# Patient Record
Sex: Female | Born: 1950 | Race: White | Hispanic: No | Smoking: Never smoker
Health system: Southern US, Community
[De-identification: ages and names within clinical notes are randomized; demographics above are authoritative.]

## PROBLEM LIST (undated history)

## (undated) DIAGNOSIS — F419 Anxiety disorder, unspecified: Secondary | ICD-10-CM

## (undated) DIAGNOSIS — G4730 Sleep apnea, unspecified: Secondary | ICD-10-CM

## (undated) DIAGNOSIS — J40 Bronchitis, not specified as acute or chronic: Secondary | ICD-10-CM

## (undated) DIAGNOSIS — K219 Gastro-esophageal reflux disease without esophagitis: Secondary | ICD-10-CM

## (undated) DIAGNOSIS — J189 Pneumonia, unspecified organism: Secondary | ICD-10-CM

## (undated) DIAGNOSIS — I10 Essential (primary) hypertension: Secondary | ICD-10-CM

## (undated) HISTORY — PX: OTHER SURGICAL HISTORY: SHX169

## (undated) HISTORY — PX: IRIDOTOMY / IRIDECTOMY: SHX165

## (undated) HISTORY — PX: BREAST SURGERY: SHX581

## (undated) HISTORY — PX: OVARY SURGERY: SHX727

---

## 2014-10-25 ENCOUNTER — Encounter: Payer: Self-pay | Admitting: Internal Medicine

## 2015-06-07 DIAGNOSIS — R053 Chronic cough: Secondary | ICD-10-CM | POA: Insufficient documentation

## 2015-06-07 DIAGNOSIS — R0982 Postnasal drip: Secondary | ICD-10-CM | POA: Insufficient documentation

## 2015-06-07 DIAGNOSIS — F411 Generalized anxiety disorder: Secondary | ICD-10-CM | POA: Insufficient documentation

## 2015-06-07 DIAGNOSIS — R05 Cough: Secondary | ICD-10-CM | POA: Insufficient documentation

## 2015-06-07 DIAGNOSIS — R5383 Other fatigue: Secondary | ICD-10-CM | POA: Insufficient documentation

## 2015-08-01 ENCOUNTER — Encounter: Payer: Self-pay | Admitting: Internal Medicine

## 2016-09-26 DIAGNOSIS — E042 Nontoxic multinodular goiter: Secondary | ICD-10-CM | POA: Insufficient documentation

## 2017-01-24 DIAGNOSIS — E7800 Pure hypercholesterolemia, unspecified: Secondary | ICD-10-CM | POA: Insufficient documentation

## 2018-01-27 ENCOUNTER — Encounter: Payer: Self-pay | Admitting: Internal Medicine

## 2018-01-28 ENCOUNTER — Ambulatory Visit (INDEPENDENT_AMBULATORY_CARE_PROVIDER_SITE_OTHER): Payer: 59 | Admitting: Internal Medicine

## 2018-01-28 ENCOUNTER — Encounter: Payer: Self-pay | Admitting: Internal Medicine

## 2018-01-28 ENCOUNTER — Institutional Professional Consult (permissible substitution): Payer: Self-pay | Admitting: Internal Medicine

## 2018-01-28 VITALS — BP 130/80 | HR 80 | Ht 61.5 in | Wt 165.0 lb

## 2018-01-28 DIAGNOSIS — G4733 Obstructive sleep apnea (adult) (pediatric): Secondary | ICD-10-CM | POA: Diagnosis not present

## 2018-01-28 DIAGNOSIS — K219 Gastro-esophageal reflux disease without esophagitis: Secondary | ICD-10-CM

## 2018-01-28 DIAGNOSIS — G4739 Other sleep apnea: Secondary | ICD-10-CM | POA: Diagnosis not present

## 2018-01-28 DIAGNOSIS — I10 Essential (primary) hypertension: Secondary | ICD-10-CM

## 2018-01-28 DIAGNOSIS — F5101 Primary insomnia: Secondary | ICD-10-CM | POA: Diagnosis not present

## 2018-01-28 DIAGNOSIS — H409 Unspecified glaucoma: Secondary | ICD-10-CM | POA: Insufficient documentation

## 2018-01-28 DIAGNOSIS — G4700 Insomnia, unspecified: Secondary | ICD-10-CM | POA: Insufficient documentation

## 2018-01-28 NOTE — Assessment & Plan Note (Signed)
She clearly benefits from CPAP, noting improved sleep, prevention of gasping and morning headache.  She is comfortable with her pressures and nasal pillows mask.  Needs help as she has moved into this area, establishing with local DME.  Was working with [facility] in [place] so we will contact them. Plan-local DME to continue Auto Servo Ventilation EPAP 6, Min PS 5, Max PS 12

## 2018-01-28 NOTE — Patient Instructions (Signed)
Order- new DME ([facility] was her DME in [place]) to continue ASV EPAP 6, Min PS 5, Max PS 12, mask of choice, humidifier, supplies, AirView , chin strap   Dx OSA  Please call as needed

## 2018-01-28 NOTE — Assessment & Plan Note (Signed)
Chronic insomnia issue which she manages.  Uses Ativan only about once a week by her report.  Recognizes a component of anxiety.  Emphasis on good sleep hygiene, minimize dependence on medication where possible.

## 2018-01-28 NOTE — Progress Notes (Signed)
01/28/2018-67 year old female never smoker for sleep evaluation. Sleep Consult: Self referral. Pt currently on BiPAP through [facility]/ [place] with hx Mixed Sleep Apnea Medical problem list includes HBP, thyroid nodules, anxiety, GERD, Glaucoma  NPSG 10/25/14 [facility] AHI 40.6/ hr (Central 102, Obst 40, hypopneas 171), Desaturation to 83%, body weight  CPAP titration 08/01/15- [facility]V for Central predominant Mixed OSA  Download 98% compliance AHI 0.4/hour on Auto Servo Ventilation EPAP6, Min PS 5, Max PS 12 She started CPAP in [place] and was changed quickly to BiPAP for mixed OSA.  She feels much better off with her Pap device which prevents gasping and prevents morning headaches. Still wakes frequently at night and gives history of nonspecific insomnia.  Bedtime between 10 and 11 PM estimating 30 to 60-minute sleep latency, up at 8 AM.  Tried trazodone years ago.  Has settled on Ativan 1 mg used about 1 time a week.  Little caffeine.  Sleeping alone with son at the other end of the house. ENT surgery for tonsils as a child.  Occasional bronchitis but no active lung disease and never smoked.  Denies heart disease or neurologic issues. Epworth score 2  Prior to Admission medications   Medication Sig Start Date End Date Taking? Authorizing Provider  Azilsartan Medoxomil (EDARBI) 40 MG TABS Take 40 mg by mouth daily.  11/24/17  Yes [provider]  Cholecalciferol (VITAMIN D3) 400 units CAPS Take by mouth.   Yes [provider]  Coenzyme Q10 (COQ10) 200 MG CAPS Take by mouth.   Yes [provider]  Docosahexaenoic Acid (OMEGA-3 DHA) POWD Take by mouth.   Yes [provider]  esomeprazole (NEXIUM) 20 MG capsule Take 20 mg by mouth daily.    Yes [provider]  hydrochlorothiazide (MICROZIDE) 12.5 MG capsule Take 12.5 mg by mouth daily as needed. When BP is elevated 11/25/17  Yes [provider]  LORazepam  (ATIVAN) 1 MG tablet Take 1 mg by mouth daily as needed. Rare use 01/03/18  Yes [provider]  medium chain triglycerides oil (MCT OIL) OIL Take by mouth.   Yes [provider]  Multiple Vitamins-Minerals (MULTIVITAMIN ADULT PO) Take by mouth.   Yes [provider]  OLIVE LEAF PO Take by mouth.   Yes [provider]   History reviewed. No pertinent past medical history. Reviewed Care everywhere History reviewed. No pertinent surgical history. Reviewed Care everywhere  History reviewed. No pertinent family history. Social History   Socioeconomic History  . Marital status: Single    Spouse name: Not on file  . Number of children: Not on file  . Years of education: Not on file  . Highest education level: Not on file  Occupational History  . Not on file  Social Needs  . Financial resource strain: Not on file  . Food insecurity:    Worry: Not on file    Inability: Not on file  . Transportation needs:    Medical: Not on file    Non-medical: Not on file  Tobacco Use  . Smoking status: Never Smoker  . Smokeless tobacco: Never Used  Substance and Sexual Activity  . Alcohol use: Not on file  . Drug use: Never  . Sexual activity: Not on file  Lifestyle  . Physical activity:    Days per week: Not on file    Minutes per session: Not on file  . Stress: Not on file  Relationships  . Social  connections:    Talks on phone: Not on file    Gets together: Not on file    Attends religious service: Not on file    Active member of club or organization: Not on file    Attends meetings of clubs or organizations: Not on file    Relationship status: Not on file  . Intimate partner violence:    Fear of current or ex partner: Not on file    Emotionally abused: Not on file    Physically abused: Not on file    Forced sexual activity: Not on file  Other Topics Concern  . Not on file  Social History Narrative  . Not on file   ROS-see HPI   Negative unless  "+" Constitutional:    weight loss, night sweats, fevers, chills, fatigue, lassitude. HEENT:    headaches, difficulty swallowing, tooth/dental problems, sore throat,       sneezing, itching, ear ache, nasal congestion, post nasal drip, snoring CV:    chest pain, orthopnea, PND, swelling in lower extremities, anasarca,                                  dizziness, palpitations Resp:   shortness of breath with exertion or at rest.                productive cough,   non-productive cough, coughing up of blood.              change in color of mucus.  wheezing.   Skin:    rash or lesions. GI:  No-   heartburn, indigestion, abdominal pain, nausea, vomiting, diarrhea,                 change in bowel habits, loss of appetite GU: dysuria, change in color of urine, no urgency or frequency.   flank pain. MS:   joint pain, stiffness, decreased range of motion, back pain. Neuro-     nothing unusual Psych:  change in mood or affect.  depression or anxiety.   memory loss.  OBJ- Physical Exam General- Alert, Oriented, Affect-appropriate, Distress- none acute + overweight Skin- rash-none, lesions- none, excoriation- none Lymphadenopathy- none Head- atraumatic            Eyes- Gross vision intact, PERRLA, conjunctivae and secretions clear            Ears- Hearing, canals-normal            Nose- Clear, no-Septal dev, mucus, polyps, erosion, perforation             Throat- Mallampati III-IV , mucosa clear , drainage- none, tonsils- atrophic Neck- flexible , trachea midline, no stridor , thyroid nl, carotid no bruit Chest - symmetrical excursion , unlabored           Heart/CV- RRR , no murmur , no gallop  , no rub, nl s1 s2                           - JVD- none , edema- none, stasis changes- none, varices- none           Lung- clear to P&A, wheeze- none, cough- none , dullness-none, rub- none           Chest wall-  Abd-  Br/ Gen/ Rectal- Not done, not indicated Extrem- cyanosis- none, clubbing, none,  atrophy- none, strength- nl Neuro-  grossly intact to observation

## 2018-03-24 DIAGNOSIS — H9311 Tinnitus, right ear: Secondary | ICD-10-CM | POA: Insufficient documentation

## 2018-03-24 DIAGNOSIS — J302 Other seasonal allergic rhinitis: Secondary | ICD-10-CM | POA: Insufficient documentation

## 2018-03-24 DIAGNOSIS — H6983 Other specified disorders of Eustachian tube, bilateral: Secondary | ICD-10-CM | POA: Insufficient documentation

## 2018-03-24 NOTE — Progress Notes (Deleted)
 @  Patient ID: Kayla Hebert, female    DOB: 04/21/1951, 67 y.o.   MRN: [id number]  No chief complaint on file.   Referring provider: No ref. provider found  HPI: 67 year old female, never smoked. PMH hypertension, mixed sleep apnea(BiPAP), insomnia, GERD. Patient of Dr. Annamaria Boots, last seen in August 2019.  New DME ([facility] was her DME in [place]) to continue ASV EPAP 6, Min PS 5, Max PS 12, mask of choice, humidifier, supplies, AirView , chin strap     03/25/18 Complains of sore throat and loud snoring. There was one week at the end of September that patient did not wear her BiPAP, other than that she has been 100% compliant with use and AHI is low.     Airview Download  84/90 days; 83 days >4 hours Average use 7 hours 11min ASV mode EPAP 6cm H20 Min PS 5cm H20 Max PS 12cm H20  Minimal air leaks AHI 0.4    Allergies  Allergen Reactions  . Codeine Nausea Only  . Prochlorperazine     Jaw tightness     There is no immunization history on file for this patient.  No past medical history on file.  Tobacco History: Social History   Tobacco Use  Smoking Status Never Smoker  Smokeless Tobacco Never Used   Counseling given: Not Answered   Outpatient Medications Prior to Visit  Medication Sig Dispense Refill  . Azilsartan Medoxomil (EDARBI) 40 MG TABS Take 40 mg by mouth daily.     . Cholecalciferol (VITAMIN D3) 400 units CAPS Take by mouth.    . Coenzyme Q10 (COQ10) 200 MG CAPS Take by mouth.    . Docosahexaenoic Acid (OMEGA-3 DHA) POWD Take by mouth.    . esomeprazole (NEXIUM) 20 MG capsule Take 20 mg by mouth daily.     . hydrochlorothiazide (MICROZIDE) 12.5 MG capsule Take 12.5 mg by mouth daily as needed. When BP is elevated    . LORazepam (ATIVAN) 1 MG tablet Take 1 mg by mouth daily as needed. Rare use    . medium chain triglycerides oil (MCT OIL) OIL Take by mouth.    . Multiple Vitamins-Minerals (MULTIVITAMIN ADULT PO) Take by mouth.    . OLIVE LEAF PO Take  by mouth.     No facility-administered medications prior to visit.       Review of Systems  Review of Systems   Physical Exam  There were no vitals taken for this visit. Physical Exam   Lab Results:  CBC No results found for: WBC, RBC, HGB, HCT, PLT, MCV, MCH, MCHC, RDW, LYMPHSABS, MONOABS, EOSABS, BASOSABS  BMET No results found for: NA, K, CL, CO2, GLUCOSE, BUN, CREATININE, CALCIUM, GFRNONAA, GFRAA  BNP No results found for: BNP  ProBNP No results found for: PROBNP  Imaging: No results found.   Assessment & Plan:   No problem-specific Assessment & Plan notes found for this encounter.     Martyn Ehrich, NP 03/24/2018

## 2018-03-25 ENCOUNTER — Ambulatory Visit: Payer: 59 | Admitting: Primary Care

## 2018-03-27 ENCOUNTER — Ambulatory Visit: Payer: 59 | Admitting: Primary Care

## 2018-04-10 NOTE — Progress Notes (Addendum)
[facility] Note  04/13/2018     CHIEF COMPLAINT Patient presents for Retina Evaluation   HISTORY OF PRESENT ILLNESS: Kayla Hebert is a 67 y.o. female who presents to the clinic today for:   HPI    Retina Evaluation    In both eyes.  This started 12 months ago.  Associated Symptoms Pain.  Negative for Flashes, Blind Spot, Photophobia, Scalp Tenderness, Fever, Floaters, Glare, Jaw Claudication, Weight Loss, Distortion, Redness, Trauma, Shoulder/Hip pain and Fatigue.  Context:  computer work and reading.  Treatments tried include no treatments (Blinking helps to clear vision. ).  Response to treatment was no improvement.  I, the attending physician,  performed the HPI with the patient and updated documentation appropriately.          Comments    Patient states vision blurred only after reading for long periods or looking at computer screen for a long time. Blinking seems to help clear vision. Patient using anti-allergy gtts for itching OU, History of LPI OU for narrow angles. Patient denies any distortion or wavy vision OU. No flashes or floaters noticed OU. 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        Last edited by Bernarda Caffey, MD on 04/13/2018  2:55 PM. (History)    pt states she saw Dr. Hinton Lovely last week, she states she recently moved here from [facility], pt states she told her optometrist there that she was having problems with her eyes and she was dx with dry eye syndrome, pt states she has had sx for glaucoma, pt states she had been told to use drops for DES, but she doesn't use them much, pt states blinking seems to clear vision up a little bit, pt states Dr. Hinton Lovely told her she had some "dips" in her eyes that were not supposed to be there  Referring physician: Arlyss Queen, MD [place], [place] [address]  HISTORICAL INFORMATION:   Selected notes from the MEDICAL RECORD NUMBER Referred by  Dr. 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 Hallahan) [BCVA: OD: 20/20- OS: 20/20-] Ocular Hx-ANA OU, Uveitis OD, SLT OU, Iriditomy OU, Glaucoma suspect, choroidal nevus OD, cataract OU PMH-HTN    CURRENT MEDICATIONS: No current outpatient medications on file. (Ophthalmic Drugs)   No current facility-administered medications for this visit.  (Ophthalmic Drugs)   Current Outpatient Medications (Other)  Medication Sig  . Azilsartan Medoxomil (EDARBI) 40 MG TABS Take 40 mg by mouth daily.   . Cholecalciferol (VITAMIN D3) 400 units CAPS Take by mouth.  . Coenzyme Q10 (COQ10) 200 MG CAPS Take by mouth.  . Docosahexaenoic Acid (OMEGA-3 DHA) POWD Take by mouth.  . esomeprazole (NEXIUM) 20 MG capsule Take 20 mg by mouth daily.   . fluticasone (FLONASE) 50 MCG/ACT nasal spray Place into the nose as directed.  . hydrochlorothiazide (MICROZIDE) 12.5 MG capsule Take 12.5 mg by mouth daily as needed. When BP is elevated  . LORazepam (ATIVAN) 1 MG tablet Take 1 mg by mouth daily as needed. Rare use  . Multiple Vitamins-Minerals (MULTIVITAMIN ADULT PO) Take by mouth.  . OLIVE LEAF PO Take by mouth.  . medium chain triglycerides oil (MCT OIL) OIL Take by mouth.   No current facility-administered medications for this visit.  (Other)      REVIEW OF SYSTEMS: ROS    Positive for: Genitourinary, Eyes   Negative for: Constitutional, Gastrointestinal,  Neurological, Skin, Musculoskeletal, HENT, Endocrine, Cardiovascular, Respiratory, Psychiatric, Allergic/Imm, Heme/Lymph   Last edited by Roselee Nova D on 04/13/2018  2:21 PM. (History)       ALLERGIES Allergies  Allergen Reactions  . Codeine Nausea Only  . Prochlorperazine     Jaw tightness    PAST MEDICAL HISTORY History reviewed. No pertinent past medical history. Past Surgical History:  Procedure Laterality Date  . LPI Bilateral     FAMILY HISTORY Family History  Problem Relation Age of  Onset  . Glaucoma Mother     SOCIAL HISTORY Social History   Tobacco Use  . Smoking status: Never Smoker  . Smokeless tobacco: Never Used  Substance Use Topics  . Alcohol use: Not Currently  . Drug use: Never         OPHTHALMIC EXAM:  Base Eye Exam    Visual Acuity (Snellen - Linear)      Right Left   Dist cc 20/25 -2 20/20 -2   Dist ph cc NI NI   Correction:  Glasses       Tonometry (Tonopen, 2:37 PM)      Right Left   Pressure 15 18       Pupils      Dark Light Shape React APD   Right 4 3 Round Slow None   Left 4 3 Round Slow None       Visual Fields (Counting fingers)      Left Right    Full Full       Extraocular Movement      Right Left    Full, Ortho Full, Ortho       Neuro/Psych    Oriented x3:  Yes   Mood/Affect:  Normal       Dilation    Both eyes:  1.0% Mydriacyl, 2.5% Phenylephrine @ 2:37 PM        Slit Lamp and Fundus Exam    Slit Lamp Exam      Right Left   Lids/Lashes Normal Normal   Conjunctiva/Sclera White and quiet White and quiet   Cornea Arcus Arcus   Anterior Chamber Deep and quiet, narrow temporal angle Deep and quiet, narrow temporal angle   Iris Round and well dilated, patent LPI at 1200 Round and well dilated, patent LPI at 0100   Lens 2+ Nuclear sclerosis, 3+ Cortical cataract, 1+ Posterior subcapsular cataract 2+ Nuclear sclerosis, 3+ Cortical cataract, trace PSC   Vitreous mild Vitreous syneresis mild Vitreous syneresis       Fundus Exam      Right Left   Disc Pink and Sharp sharp rim, temp PPP   C/D Ratio 0.3 0.5   Macula Good foveal reflex, Retinal pigment epithelial mottling, Drusen, No heme or edema Flat, Good foveal reflex, Drusen, Retinal pigment epithelial mottling, No heme or edema   Vessels Vascular attenuation, mild AV crossing changes Vascular attenuation, mild AV crossing changes   Periphery Attached    Attached           Refraction    Wearing Rx      Sphere Cylinder Axis Add   Right +4.50 sph    +2.50   Left +3.00 +1.25 032 +2.50   Age:  4 yrs   Type:  PAL       Manifest Refraction      Sphere Cylinder Axis Dist VA   Right +4.25 +0.25 180 20/25+2   Left +3.50 +1.50 037 20/20-2  IMAGING AND PROCEDURES  Imaging and Procedures for @TODAY @  OCT, Retina - OU - Both Eyes       Right Eye Quality was good. Central Foveal Thickness: 262. Progression has no prior data. Findings include normal foveal contour, no IRF, no SRF, retinal drusen , vitreomacular adhesion  (Central RPE prominance).   Left Eye Central Foveal Thickness: 261. Progression has no prior data. Findings include normal foveal contour, no SRF, no IRF, vitreomacular adhesion , retinal drusen  (Central RPE  prominance).   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 CME - cystoid macular edema. PED - pigment epithelial detachment. IRF - intraretinal fluid. SRF - subretinal fluid. EZ - ellipsoid zone. ERM - epiretinal membrane. ORA - outer retinal atrophy. ORT - outer retinal tubulation. SRHM - subretinal hyper-reflective material                 ASSESSMENT/PLAN:    ICD-10-CM   1. 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 Retinal edema H35.81 OCT, Retina - OU - Both Eyes  3. Essential hypertension I10   4. Hypertensive retinopathy of both eyes H35.033   5. 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 Age related macular degeneration, non-exudative, both eyes   - early stage w/ prominent central druse OU vs mild vitelliform-like lesion  - The incidence, anatomy, and pathology of dry AMD, risk of progression, and the AREDS and AREDS 2 study including smoking  risks discussed with patient.  - Recommend amsler grid monitoring  - differential includes adult vitelliform dystrophy  - recommended baseline FA -- pt wishes to defer for now  - f/u 3 months, DFE, OCT, FA (transit OD)  2. No retinal edema on exam or OCT  3,4. Hypertensive retinopathy OU - discussed importance of tight BP control - monitor  5. Anatomic narrow angles OU - s/p LPI OU -- patent - IOP okay - glaucoma work up and management per Dr. Hinton Lovely  6. Mixed Cataract OU - The symptoms of cataract, surgical options, and treatments and risks were discussed with patient. - discussed diagnosis and progression - not yet visually significant - monitor for now   Ophthalmic Meds Ordered this visit:  No orders of the defined types were placed in this encounter.      Return in about 3 months (around 07/14/2018), or F/U non-exu ARMD, DFE, OCT, FA.  There are no Patient Instructions on file for this visit.   Explained the diagnoses, plan, and follow up with the patient and they expressed understanding.  Patient expressed understanding of the importance of proper follow up care.   This document serves as a record of services personally performed by Gardiner Sleeper, MD, PhD. It was created on their behalf by Ernest Mallick, OA, an ophthalmic assistant. The creation of this record is the provider's dictation and/or activities during the visit.    Electronically signed by: Ernest Mallick, OA  10.25.19 11:13 PM    Gardiner Sleeper, M.D., Ph.D. Diseases & Surgery of the Retina and Vitreous [facility]   I have reviewed the above documentation for accuracy and completeness, and I agree with the above. Sharyon Cable  Coralyn Pear, M.D., Ph.D. 04/14/18 11:13 PM    Abbreviations: M myopia (nearsighted); A astigmatism; H hyperopia (farsighted); P presbyopia; Mrx spectacle prescription;  CTL contact lenses; OD right eye; OS left eye; OU both eyes  XT exotropia; ET esotropia; PEK  punctate epithelial keratitis; PEE punctate epithelial erosions; DES dry eye syndrome; MGD meibomian gland dysfunction; ATs artificial tears; PFAT's preservative free artificial tears; [place] nuclear sclerotic cataract; PSC posterior subcapsular cataract; ERM epi-retinal membrane; PVD posterior vitreous detachment; RD retinal detachment; DM diabetes mellitus; DR diabetic retinopathy; NPDR non-proliferative diabetic retinopathy; PDR proliferative diabetic retinopathy; CSME clinically significant macular edema; DME diabetic macular edema; dbh dot blot hemorrhages; CWS cotton wool spot; POAG primary open angle glaucoma; C/D cup-to-disc ratio; HVF humphrey visual field; GVF goldmann visual field; OCT optical coherence tomography; IOP intraocular pressure; BRVO Branch retinal vein occlusion; CRVO central retinal vein occlusion; CRAO central retinal artery occlusion; BRAO branch retinal artery occlusion; RT retinal tear; SB scleral buckle; PPV pars plana vitrectomy; VH Vitreous hemorrhage; PRP panretinal laser photocoagulation; IVK intravitreal kenalog; VMT vitreomacular traction; MH Macular hole;  NVD neovascularization of the disc; NVE neovascularization elsewhere; AREDS age related eye disease study; ARMD age related macular degeneration; POAG primary open angle glaucoma; EBMD epithelial/anterior basement membrane dystrophy; ACIOL anterior chamber intraocular lens; IOL intraocular lens; PCIOL posterior chamber intraocular lens; Phaco/IOL phacoemulsification with intraocular lens placement; [place] photorefractive keratectomy; LASIK laser assisted in situ keratomileusis; HTN hypertension; DM diabetes mellitus; COPD chronic obstructive pulmonary disease

## 2018-04-13 ENCOUNTER — Ambulatory Visit (INDEPENDENT_AMBULATORY_CARE_PROVIDER_SITE_OTHER): Payer: 59 | Admitting: Ophthalmology

## 2018-04-13 ENCOUNTER — Encounter (INDEPENDENT_AMBULATORY_CARE_PROVIDER_SITE_OTHER): Payer: Self-pay | Admitting: Ophthalmology

## 2018-04-13 DIAGNOSIS — I10 Essential (primary) hypertension: Secondary | ICD-10-CM | POA: Diagnosis not present

## 2018-04-13 DIAGNOSIS — H3581 Retinal edema: Secondary | ICD-10-CM | POA: Diagnosis not present

## 2018-04-13 DIAGNOSIS — H40033 Anatomical narrow angle, bilateral: Secondary | ICD-10-CM

## 2018-04-13 DIAGNOSIS — H25813 Combined forms of age-related cataract, bilateral: Secondary | ICD-10-CM

## 2018-04-13 DIAGNOSIS — H353131 Nonexudative age-related macular degeneration, bilateral, early dry stage: Secondary | ICD-10-CM

## 2018-04-13 DIAGNOSIS — H35033 Hypertensive retinopathy, bilateral: Secondary | ICD-10-CM

## 2018-04-14 ENCOUNTER — Encounter (INDEPENDENT_AMBULATORY_CARE_PROVIDER_SITE_OTHER): Payer: Self-pay | Admitting: Ophthalmology

## 2018-04-21 ENCOUNTER — Encounter (INDEPENDENT_AMBULATORY_CARE_PROVIDER_SITE_OTHER): Payer: Self-pay | Admitting: Ophthalmology

## 2018-04-23 ENCOUNTER — Institutional Professional Consult (permissible substitution): Payer: Self-pay | Admitting: Internal Medicine

## 2018-05-31 ENCOUNTER — Encounter: Payer: Self-pay | Admitting: Internal Medicine

## 2018-06-01 ENCOUNTER — Encounter: Payer: Self-pay | Admitting: Internal Medicine

## 2018-06-01 ENCOUNTER — Ambulatory Visit (INDEPENDENT_AMBULATORY_CARE_PROVIDER_SITE_OTHER): Payer: 59 | Admitting: Internal Medicine

## 2018-06-01 VITALS — BP 122/78 | HR 88 | Ht 61.5 in | Wt 158.4 lb

## 2018-06-01 DIAGNOSIS — F5101 Primary insomnia: Secondary | ICD-10-CM | POA: Diagnosis not present

## 2018-06-01 DIAGNOSIS — G4733 Obstructive sleep apnea (adult) (pediatric): Secondary | ICD-10-CM | POA: Diagnosis not present

## 2018-06-01 DIAGNOSIS — I10 Essential (primary) hypertension: Secondary | ICD-10-CM

## 2018-06-01 DIAGNOSIS — G4739 Other sleep apnea: Secondary | ICD-10-CM | POA: Diagnosis not present

## 2018-06-01 NOTE — Progress Notes (Signed)
01/28/2018-67 year old female never smoker for sleep evaluation. Sleep Consult: Self referral. Pt currently on BiPAP through [facility]/ [place] with hx Mixed Sleep Apnea Medical problem list includes HBP, thyroid nodules, anxiety, GERD, Glaucoma  NPSG 10/25/14 [place] AHI 40.6/ hr (Central 102, Obst 40, hypopneas 171), Desaturation to 83%, body weight  CPAP titration 08/01/15- [facility] for Central predominant Mixed OSA  Download 98% compliance AHI 0.4/hour on Auto Servo Ventilation EPAP6, Min PS 5, Max PS 12 She started CPAP in [place] and was changed quickly to BiPAP for mixed OSA.  She feels much better off with her Pap device which prevents gasping and prevents morning headaches. Still wakes frequently at night and gives history of nonspecific insomnia.  Bedtime between 10 and 11 PM estimating 30 to 60-minute sleep latency, up at 8 AM.  Tried trazodone years ago.  Has settled on Ativan 1 mg used about 1 time a week.  Little caffeine.  Sleeping alone with son at the other end of the house. ENT surgery for tonsils as a child.  Occasional bronchitis but no active lung disease and never smoked.  Denies heart disease or neurologic issues. Epworth score 2  06/01/2018-67 year old female never smoker followed for Mixed Sleep Apnea, Insomnia, complicated by HBP, thyroid nodules, anxiety, GERD, glaucoma AutoServ Vent EPAP 6, Min PS 5, max PS 12/ [facility] -----OSA: DME: [facility] Pt wears CPAP nightly and DL attached.  Download 97% compliance AHI 0.5/hour Lives alone but aware of snoring.  Awakens with morning headache if she does not use CPAP.  Does not want a full facemask.  Complains chinstrap is too big.  Wakes frequently with dry mouth.  Using Ativan about 10 times a month to help insomnia.  ROS-see HPI   + = positive Constitutional:    weight loss, night sweats, fevers, chills, fatigue, lassitude. HEENT:    headaches, difficulty swallowing, tooth/dental  problems, sore throat,       sneezing, itching, ear ache, nasal congestion, post nasal drip, snoring CV:    chest pain, orthopnea, PND, swelling in lower extremities, anasarca,                                  dizziness, palpitations Resp:   shortness of breath with exertion or at rest.                productive cough,   non-productive cough, coughing up of blood.              change in color of mucus.  wheezing.   Skin:    rash or lesions. GI:  No-   heartburn, indigestion, abdominal pain, nausea, vomiting, diarrhea,                 change in bowel habits, loss of appetite GU: dysuria, change in color of urine, no urgency or frequency.   flank pain. MS:   joint pain, stiffness, decreased range of motion, back pain. Neuro-     nothing unusual Psych:  change in mood or affect.  depression or anxiety.   memory loss.  OBJ- Physical Exam General- Alert, Oriented, Affect-appropriate, Distress- none acute + overweight Skin- rash-none, lesions- none, excoriation- none Lymphadenopathy- none Head- atraumatic            Eyes- Gross vision intact, PERRLA, conjunctivae and secretions clear            Ears- Hearing,  canals-normal            Nose- Clear, no-Septal dev, mucus, polyps, erosion, perforation             Throat- Mallampati III-IV , mucosa clear , drainage- none, tonsils- atrophic Neck- flexible , trachea midline, no stridor , thyroid nl, carotid no bruit Chest - symmetrical excursion , unlabored           Heart/CV- RRR , no murmur , no gallop  , no rub, nl s1 s2                           - JVD- none , edema- none, stasis changes- none, varices- none           Lung- clear to P&A, wheeze- none, cough- none , dullness-none, rub- none           Chest wall-  Abd-  Br/ Gen/ Rectal- Not done, not indicated Extrem- cyanosis- none, clubbing, none, atrophy- none, strength- nl Neuro- grossly intact to observation

## 2018-06-01 NOTE — Patient Instructions (Signed)
OSA: DME: [facility] Pt wears CPAP nightly and DL attached. We can continue ASV EPAP 6, PS min 5, max 12  Order- DME [facility]- please supply small size chin strap  Suggest trying otc nasal saline gel for dry nose- at bedtime and as needed  NeilMed, AYR, strore brands  Order- [facility] pleaser refer to primary care to establish

## 2018-06-15 ENCOUNTER — Other Ambulatory Visit: Payer: Self-pay | Admitting: Gastroenterology

## 2018-06-15 DIAGNOSIS — R1011 Right upper quadrant pain: Secondary | ICD-10-CM

## 2018-06-16 ENCOUNTER — Ambulatory Visit: Payer: 59 | Admitting: Medical

## 2018-06-17 ENCOUNTER — Encounter (HOSPITAL_BASED_OUTPATIENT_CLINIC_OR_DEPARTMENT_OTHER): Payer: Self-pay | Admitting: Emergency Medicine

## 2018-06-17 ENCOUNTER — Emergency Department (HOSPITAL_BASED_OUTPATIENT_CLINIC_OR_DEPARTMENT_OTHER)
Admission: EM | Admit: 2018-06-17 | Discharge: 2018-06-17 | Disposition: A | Discharge status: Home / Self Care | Payer: 59 | Attending: Emergency Medicine | Admitting: Emergency Medicine

## 2018-06-17 ENCOUNTER — Emergency Department (HOSPITAL_BASED_OUTPATIENT_CLINIC_OR_DEPARTMENT_OTHER): Payer: 59

## 2018-06-17 ENCOUNTER — Other Ambulatory Visit: Payer: Self-pay

## 2018-06-17 DIAGNOSIS — Z79899 Other long term (current) drug therapy: Secondary | ICD-10-CM | POA: Insufficient documentation

## 2018-06-17 DIAGNOSIS — R05 Cough: Secondary | ICD-10-CM | POA: Insufficient documentation

## 2018-06-17 DIAGNOSIS — R059 Cough, unspecified: Secondary | ICD-10-CM

## 2018-06-17 DIAGNOSIS — I10 Essential (primary) hypertension: Secondary | ICD-10-CM | POA: Insufficient documentation

## 2018-06-17 HISTORY — DX: Pneumonia, unspecified organism: J18.9

## 2018-06-17 HISTORY — DX: Anxiety disorder, unspecified: F41.9

## 2018-06-17 HISTORY — DX: Essential (primary) hypertension: I10

## 2018-06-17 HISTORY — DX: Sleep apnea, unspecified: G47.30

## 2018-06-17 HISTORY — DX: Gastro-esophageal reflux disease without esophagitis: K21.9

## 2018-06-17 HISTORY — DX: Bronchitis, not specified as acute or chronic: J40

## 2018-06-17 MED ORDER — GUAIFENESIN 100 MG/5ML PO SYRP: 200.0000 mg | ORAL_SOLUTION | ORAL | 0 refills | Status: AC | PRN

## 2018-06-17 MED ORDER — BENZONATATE 100 MG PO CAPS: 100.0000 mg | ORAL_CAPSULE | Freq: Three times a day (TID) | ORAL | 0 refills | Status: AC

## 2018-06-17 MED ORDER — GUAIFENESIN 100 MG/5ML PO SYRP: 200.0000 mg | ORAL_SOLUTION | ORAL | 0 refills | Status: DC | PRN

## 2018-06-17 MED ORDER — BENZONATATE 100 MG PO CAPS: 100.0000 mg | ORAL_CAPSULE | Freq: Three times a day (TID) | ORAL | 0 refills | Status: DC

## 2018-06-17 NOTE — ED Triage Notes (Signed)
Cough for a month.  Sts it got better for a few days but today is coughing almost nonstop. Hx of bronchitis. Also runny nose.

## 2018-06-17 NOTE — ED Provider Notes (Signed)
[facility] Provider Note   CSN: [id number] Arrival date & time: 06/17/18  1716     History   Chief Complaint Chief Complaint  Patient presents with  . Cough    HPI Kayla Hebert is a 68 y.o. female.  The history is provided by the patient. No language interpreter was used.  Cough      68 year old female with history of bronchitis, prior pneumonia, GERD, presenting for evaluation cough.  Patient states for the past month she has had recurrent cough.  Cough is productive with sputum and she also endorsed having postnasal drip.  Cough worse in the morning and can improve throughout the day.  Today her cough was persistent prompting her to come here.  She denies any associated fever or chills, no shortness of breath, no wheezing, no upper respiratory symptoms and no exertional chest pain.  She mention in the past Tessalon Perles, and cough liquid medication has helped her.  She is not a smoker.  She does not want any chest x-ray at this time.  Past Medical History:  Diagnosis Date  . Anxiety   . Bronchitis   . GERD (gastroesophageal reflux disease)   . Hypertension   . Pneumonia   . Sleep apnea     Patient Active Problem List   Diagnosis Date Noted  . 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 Insomnia 01/28/2018  . Hypertension, essential 01/28/2018  . Glaucoma 01/28/2018  . GERD (gastroesophageal reflux disease) 01/28/2018    Past Surgical History:  Procedure Laterality Date  . BREAST SURGERY    . CESAREAN SECTION    . LPI Bilateral   . OVARY SURGERY       OB History   No obstetric history on file.      Home Medications    Prior to Admission medications   Medication Sig Start Date End Date Taking? Authorizing Provider  Azilsartan Medoxomil (EDARBI) 40 MG TABS Take 40 mg by mouth daily.  11/24/17   [provider]  Cholecalciferol (VITAMIN D3) 400 units CAPS Take by mouth.    [provider]  Coenzyme Q10 (COQ10) 200  MG CAPS Take by mouth.    [provider]  Docosahexaenoic Acid (OMEGA-3 DHA) POWD Take by mouth.    [provider]  esomeprazole (NEXIUM) 20 MG capsule Take 20 mg by mouth daily.     [provider]  fluticasone (FLONASE) 50 MCG/ACT nasal spray Place into the nose as directed. 03/24/18   [provider]  hydrochlorothiazide (MICROZIDE) 12.5 MG capsule Take 12.5 mg by mouth daily as needed. When BP is elevated 11/25/17   [provider]  LORazepam (ATIVAN) 1 MG tablet Take 1 mg by mouth daily as needed. Rare use 01/03/18   [provider]  medium chain triglycerides oil (MCT OIL) OIL Take by mouth.    [provider]  Multiple Vitamins-Minerals (MULTIVITAMIN ADULT PO) Take by mouth.    [provider]  OLIVE LEAF PO Take by mouth.    [provider]    Family History Family History  Problem Relation Age of Onset  . Glaucoma Mother     Social History Social History   Tobacco Use  . Smoking status: Never Smoker  . Smokeless tobacco: Never Used  Substance Use Topics  . Alcohol use: Not Currently  . Drug use: Never     Allergies   Codeine and Prochlorperazine   Review of Systems Review of Systems  Respiratory:  Positive for cough.   All other systems reviewed and are negative.    Physical Exam Updated Vital Signs BP (!) 149/80 (BP Location: Right Arm)   Pulse 85   Temp 98.6 F (37 C) (Oral)   Resp 20   Ht 5\' 2"  (1.575 m)   Wt 72.6 kg   SpO2 100%   BMI 29.26 kg/m   Physical Exam Vitals signs and nursing note reviewed.  Constitutional:      General: She is not in acute distress.    Appearance: She is well-developed.  HENT:     Head: Atraumatic.     Comments: Ears: Normal TMs bilateral Nose: Normal nares Throat: Uvula midline no tonsillar enlargement or exudates, no trismus Eyes:     Conjunctiva/sclera: Conjunctivae normal.  Neck:     Musculoskeletal: Neck supple.  Cardiovascular:      Rate and Rhythm: Normal rate and regular rhythm.     Pulses: Normal pulses.     Heart sounds: Normal heart sounds.  Pulmonary:     Breath sounds: Normal breath sounds. No wheezing, rhonchi or rales.  Abdominal:     Palpations: Abdomen is soft.     Tenderness: There is no abdominal tenderness.  Skin:    Findings: No rash.  Neurological:     Mental Status: She is alert and oriented to person, place, and time.      ED Treatments / Results  Labs (all labs ordered are listed, but only abnormal results are displayed) Labs Reviewed - No data to display  EKG None  Radiology No results found.  Procedures Procedures (including critical care time)  Medications Ordered in ED Medications - No data to display   Initial Impression / Assessment and Plan / ED Course  I have reviewed the triage vital signs and the nursing notes.  Pertinent labs & imaging results that were available during my care of the patient were reviewed by me and considered in my medical decision making (see chart for details).     BP (!) 149/80 (BP Location: Right Arm)   Pulse 85   Temp 98.6 F (37 C) (Oral)   Resp 20   Ht 5\' 2"  (1.575 m)   Wt 72.6 kg   SpO2 100%   BMI 29.26 kg/m    Final Clinical Impressions(s) / ED Diagnoses   Final diagnoses:  Cough    ED Discharge Orders         Ordered    benzonatate (TESSALON) 100 MG capsule  Every 8 hours,   Status:  Discontinued     06/17/18 1844    guaifenesin (ROBITUSSIN) 100 MG/5ML syrup  Every 4 hours PRN,   Status:  Discontinued     06/17/18 1844    benzonatate (TESSALON) 100 MG capsule  Every 8 hours     06/17/18 1845    guaifenesin (ROBITUSSIN) 100 MG/5ML syrup  Every 4 hours PRN     06/17/18 1845         6:12 PM Patient has had a recurrent cough for the past month.  She does not have any shortness of breath, no fever.  Examination is unremarkable.  She is not hypoxic, no wheezing noted.  Patient declined chest x-ray and requesting for  symptomatic treatment only at this time.  She is well-appearing, plan to discharge home with Tessalon Perles, and guaifenesin. Care discussed with Dr. Maryan Rued.   Domenic Moras, PA-C 06/17/18 Ellin Mayhew, MD 06/18/18 2131

## 2018-06-22 ENCOUNTER — Other Ambulatory Visit: Payer: Self-pay | Admitting: Gastroenterology

## 2018-06-22 DIAGNOSIS — R1011 Right upper quadrant pain: Secondary | ICD-10-CM

## 2018-06-25 ENCOUNTER — Ambulatory Visit (INDEPENDENT_AMBULATORY_CARE_PROVIDER_SITE_OTHER): Payer: 59 | Admitting: Medical

## 2018-06-25 ENCOUNTER — Encounter: Payer: Self-pay | Admitting: Medical

## 2018-06-25 VITALS — BP 108/57 | HR 71 | Temp 99.0°F | Resp 16 | Ht 62.0 in | Wt 162.4 lb

## 2018-06-25 DIAGNOSIS — E7800 Pure hypercholesterolemia, unspecified: Secondary | ICD-10-CM

## 2018-06-25 DIAGNOSIS — K219 Gastro-esophageal reflux disease without esophagitis: Secondary | ICD-10-CM

## 2018-06-25 DIAGNOSIS — G4739 Other sleep apnea: Secondary | ICD-10-CM | POA: Diagnosis not present

## 2018-06-25 DIAGNOSIS — I10 Essential (primary) hypertension: Secondary | ICD-10-CM

## 2018-06-25 DIAGNOSIS — J3089 Other allergic rhinitis: Secondary | ICD-10-CM

## 2018-06-25 DIAGNOSIS — R739 Hyperglycemia, unspecified: Secondary | ICD-10-CM

## 2018-06-25 DIAGNOSIS — R5383 Other fatigue: Secondary | ICD-10-CM

## 2018-06-25 DIAGNOSIS — E559 Vitamin D deficiency, unspecified: Secondary | ICD-10-CM

## 2018-06-25 DIAGNOSIS — F411 Generalized anxiety disorder: Secondary | ICD-10-CM

## 2018-06-25 NOTE — Patient Instructions (Addendum)
For your reflux recommend that you continue nexium. Follow up with Dr. Benson Norway.  For htn continue current bp medication regimen.   For sleep apnea continue bpap.  For high cholesterol continue low cholesterol diet. On follow up get fasting lipid panel.  For allergies continue with flonase.  Will place below future labs. Can get tomorrow.  Will rx ativan for anxiety in future as discussed but follow [place] rules and regs as we discussed.  Follow up in one month or as needed

## 2018-06-25 NOTE — Progress Notes (Signed)
Subjective:    Patient ID: Kayla Hebert, female    DOB: May 23, 1951, 68 y.o.   MRN: [id number]  HPI  Pt in for first time.   Pt retired. She is taking classes at college. Enjoys [employer].   Pt new to area. She for years lived in [place]. Moved about 8 months ago.History of anxiety. She is on ativan periodically.She take it for severe anxiety. She thinks when anxious her bp increases. Pt takes 15 tab ativana month usually.   Hx of gerd- pt is nexium 40 mg. Sees gastro. Dr. Benson Norway. Pt had EGD done one year ago. Only inflammation seen.   Sleep apnea- saw Dr young pulmonologist. Pt has bipap.  Pt had htn history- pt states her bp varies all over the place. She stats bp daily. She takes daily and bp varies. She shows me on app that her bp is most of time 119-100/70 range. She states when bp over 122 sometimes get ha. She uses hctz spraringly. She states will only take edarbi., if get over 126 will take both. Pt never saw cardiologist in the past. Pt states losartan dropped bp and side effects valsartan. Pt takes edarbi about every other day.  Intermittent seasonal allergies- pt use flonase intermittently.    Review of Systems  Constitutional: Negative for chills, fatigue and fever.  HENT: Negative for congestion, ear discharge, ear pain, sinus pressure and sinus pain.   Respiratory: Negative for cough, chest tightness, shortness of breath and wheezing.   Cardiovascular: Negative for chest pain and palpitations.  Gastrointestinal: Negative for abdominal pain.       Jerrye Bushy.  Musculoskeletal: Negative for back pain.  Skin: Negative for rash.  Neurological: Negative for dizziness, seizures, facial asymmetry and weakness.  Psychiatric/Behavioral: Negative for behavioral problems, confusion, self-injury and suicidal ideas. The patient is nervous/anxious.     Past Medical History:  Diagnosis Date  . Anxiety   . Bronchitis   . GERD (gastroesophageal reflux disease)   . Hypertension    . Pneumonia   . Sleep apnea      Social History   Socioeconomic History  . Marital status: Widowed    Spouse name: Not on file  . Number of children: Not on file  . Years of education: Not on file  . Highest education level: Not on file  Occupational History  . Not on file  Social Needs  . Financial resource strain: Not on file  . Food insecurity:    Worry: Not on file    Inability: Not on file  . Transportation needs:    Medical: Not on file    Non-medical: Not on file  Tobacco Use  . Smoking status: Never Smoker  . Smokeless tobacco: Never Used  Substance and Sexual Activity  . Alcohol use: Not Currently  . Drug use: Never  . Sexual activity: Not on file  Lifestyle  . Physical activity:    Days per week: Not on file    Minutes per session: Not on file  . Stress: Not on file  Relationships  . Social connections:    Talks on phone: Not on file    Gets together: Not on file    Attends religious service: Not on file    Active member of club or organization: Not on file    Attends meetings of clubs or organizations: Not on file    Relationship status: Not on file  . Intimate partner violence:    Fear of current or  ex partner: Not on file    Emotionally abused: Not on file    Physically abused: Not on file    Forced sexual activity: Not on file  Other Topics Concern  . Not on file  Social History Narrative  . Not on file    Past Surgical History:  Procedure Laterality Date  . BREAST SURGERY    . CESAREAN SECTION    . LPI Bilateral   . OVARY SURGERY      Family History  Problem Relation Age of Onset  . Glaucoma Mother     Allergies  Allergen Reactions  . Codeine Nausea Only  . Prochlorperazine     Jaw tightness    Current Outpatient Medications on File Prior to Visit  Medication Sig Dispense Refill  . Azilsartan Medoxomil (EDARBI) 40 MG TABS Take 40 mg by mouth daily.     . benzonatate (TESSALON) 100 MG capsule Take 1 capsule (100 mg total) by  mouth every 8 (eight) hours. 21 capsule 0  . Cholecalciferol (VITAMIN D3) 400 units CAPS Take by mouth.    . Coenzyme Q10 (COQ10) 200 MG CAPS Take by mouth.    . Docosahexaenoic Acid (OMEGA-3 DHA) POWD Take by mouth.    . esomeprazole (NEXIUM) 20 MG capsule Take 20 mg by mouth daily.     . fluticasone (FLONASE) 50 MCG/ACT nasal spray Place into the nose as directed.    Marland Kitchen guaifenesin (ROBITUSSIN) 100 MG/5ML syrup Take 10 mLs (200 mg total) by mouth every 4 (four) hours as needed for cough or congestion. 200 mL 0  . hydrochlorothiazide (MICROZIDE) 12.5 MG capsule Take 12.5 mg by mouth daily as needed. When BP is elevated    . LORazepam (ATIVAN) 1 MG tablet Take 1 mg by mouth daily as needed. Rare use    . medium chain triglycerides oil (MCT OIL) OIL Take by mouth.    . Multiple Vitamins-Minerals (MULTIVITAMIN ADULT PO) Take by mouth.    . OLIVE LEAF PO Take by mouth.     No current facility-administered medications on file prior to visit.     BP (!) 108/57   Pulse 71   Temp 99 F (37.2 C) (Oral)   Resp 16   Ht 5\' 2"  (1.575 m)   Wt 162 lb 6.4 oz (73.7 kg)   SpO2 99%   BMI 29.70 kg/m       Objective:   Physical Exam  General Mental Status- Alert. General Appearance- Not in acute distress.   Skin General: Color- Normal Color. Moisture- Normal Moisture.  Neck Carotid Arteries- Normal color. Moisture- Normal Moisture. No carotid bruits. No JVD.  Chest and Lung Exam Auscultation: Breath Sounds:-Normal.  Cardiovascular Auscultation:Rythm- Regular. Murmurs & Other Heart Sounds:Auscultation of the heart reveals- No Murmurs.  Abdomen Inspection:-Inspeection Normal. Palpation/Percussion:Note:No mass. Palpation and Percussion of the abdomen reveal- Non Tender, Non Distended + BS, no rebound or guarding.   Neurologic Cranial Nerve exam:- CN III-XII intact(No nystagmus), symmetric smile. Strength:- 5/5 equal and symmetric strength both upper and lower extremities.        Assessment & Plan:  For your reflux recommend that you continue nexium. Follow up with Dr. Benson Norway.  For htn continue current bp medication regimen.   For sleep apnea continue bpap.  For high cholesterol continue low chlosterol diet. On follow up get fasting lipid panel.  For allergies continue with flonase.  Will place below future labs. Can get tomorrow.  Follow up in one month or as needed  45 minutes spent with pt. 50% of time spent counseling and discussing each of her chronic conditions and treatments. Particulary in depth discussion on anxiety treatment and state law and rules.   Mackie Pai, PA-C

## 2018-06-26 ENCOUNTER — Other Ambulatory Visit (INDEPENDENT_AMBULATORY_CARE_PROVIDER_SITE_OTHER): Payer: 59

## 2018-06-26 DIAGNOSIS — E7800 Pure hypercholesterolemia, unspecified: Secondary | ICD-10-CM

## 2018-06-26 DIAGNOSIS — I10 Essential (primary) hypertension: Secondary | ICD-10-CM | POA: Diagnosis not present

## 2018-06-26 DIAGNOSIS — R5383 Other fatigue: Secondary | ICD-10-CM

## 2018-06-26 DIAGNOSIS — E559 Vitamin D deficiency, unspecified: Secondary | ICD-10-CM

## 2018-06-26 LAB — COMPREHENSIVE METABOLIC PANEL
ALT: 10 U/L (ref 0–35)
AST: 14 U/L (ref 0–37)
Albumin: 4.5 g/dL (ref 3.5–5.2)
Alkaline Phosphatase: 68 U/L (ref 39–117)
BUN: 15 mg/dL (ref 6–23)
CO2: 28 mEq/L (ref 19–32)
Calcium: 10.1 mg/dL (ref 8.4–10.5)
Chloride: 105 mEq/L (ref 96–112)
Creatinine, Ser: 0.78 mg/dL (ref 0.40–1.20)
GFR: 78.19 mL/min (ref >60.00)
Glucose, Bld: 108 mg/dL — ABNORMAL HIGH (ref 70–99)
Potassium: 4.7 mEq/L (ref 3.5–5.1)
Sodium: 141 mEq/L (ref 135–145)
Total Bilirubin: 0.5 mg/dL (ref 0.2–1.2)
Total Protein: 6.5 g/dL (ref 6.0–8.3)

## 2018-06-26 LAB — TSH: TSH: 2.48 u[IU]/mL (ref 0.35–4.50)

## 2018-06-26 LAB — CBC WITH DIFFERENTIAL/PLATELET
Basophils Absolute: 0 10*3/uL (ref 0.0–0.1)
Basophils Relative: 0.6 % (ref 0.0–3.0)
Eosinophils Absolute: 0.1 10*3/uL (ref 0.0–0.7)
Eosinophils Relative: 2.2 % (ref 0.0–5.0)
HCT: 38.1 % (ref 36.0–46.0)
Hemoglobin: 13.1 g/dL (ref 12.0–15.0)
Lymphocytes Relative: 34.6 % (ref 12.0–46.0)
Lymphs Abs: 1.7 10*3/uL (ref 0.7–4.0)
MCHC: 34.2 g/dL (ref 30.0–36.0)
MCV: 87.4 fl (ref 78.0–100.0)
Monocytes Absolute: 0.4 10*3/uL (ref 0.1–1.0)
Monocytes Relative: 7.1 % (ref 3.0–12.0)
Neutro Abs: 2.7 10*3/uL (ref 1.4–7.7)
Neutrophils Relative %: 55.5 % (ref 43.0–77.0)
Platelets: 185 10*3/uL (ref 150.0–400.0)
RBC: 4.36 Mil/uL (ref 3.87–5.11)
RDW: 13 % (ref 11.5–15.5)
WBC: 4.9 10*3/uL (ref 4.0–10.5)

## 2018-06-26 LAB — T4, FREE: Free T4: 0.98 ng/dL (ref 0.60–1.60)

## 2018-06-26 LAB — LIPID PANEL
Cholesterol: 233 mg/dL — ABNORMAL HIGH (ref 0–200)
HDL: 50.8 mg/dL (ref >39.00)
LDL Cholesterol: 156 mg/dL — ABNORMAL HIGH (ref 0–99)
NonHDL: 181.83
Total CHOL/HDL Ratio: 5
Triglycerides: 130 mg/dL (ref 0.0–149.0)
VLDL: 26 mg/dL (ref 0.0–40.0)

## 2018-06-26 LAB — VITAMIN B12: Vitamin B-12: 342 pg/mL (ref 211–911)

## 2018-06-29 LAB — VITAMIN B1: Vitamin B1 (Thiamine): 21 nmol/L (ref 8–30)

## 2018-06-30 LAB — VITAMIN D 1,25 DIHYDROXY
Vitamin D 1, 25 (OH)2 Total: 38 pg/mL (ref 18–72)
Vitamin D2 1, 25 (OH)2: <8 pg/mL
Vitamin D3 1, 25 (OH)2: 38 pg/mL

## 2018-07-03 ENCOUNTER — Ambulatory Visit
Admission: RE | Admit: 2018-07-03 | Discharge: 2018-07-03 | Disposition: A | Discharge status: Home / Self Care | Payer: 59 | Source: Ambulatory Visit | Attending: Gastroenterology | Admitting: Gastroenterology

## 2018-07-06 ENCOUNTER — Telehealth: Payer: Self-pay | Admitting: *Deleted

## 2018-07-06 NOTE — Telephone Encounter (Signed)
Received US Abdomen Limited RUQ Imaging Results from [place]; forwarded to provider/SLS 01/20

## 2018-07-12 NOTE — Progress Notes (Addendum)
[facility] Note  07/14/2018     CHIEF COMPLAINT Patient presents for Retina Follow Up   HISTORY OF PRESENT ILLNESS: Kayla Hebert is a 68 y.o. female who presents to the clinic today for:   HPI    Retina Follow Up    Patient presents with  Dry AMD.  In both eyes.  Severity is moderate.  Duration of 3 months.  Since onset it is gradually worsening.  I, the attending physician,  performed the HPI with the patient and updated documentation appropriately.          Comments    Patient states vision blurred, especially if on computer on phone. No significant change since last visit.        Last edited by Bernarda Caffey, MD on 07/14/2018  1:31 PM. (History)       No referring provider defined for this encounter.  HISTORICAL INFORMATION:   Selected notes from the MEDICAL RECORD NUMBER Referred by Dr. 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 Hallahan) [BCVA: OD: 20/20- OS: 20/20-] Ocular Hx-ANA OU, Uveitis OD, SLT OU, Iriditomy OU, Glaucoma suspect, choroidal nevus OD, cataract OU PMH-HTN    CURRENT MEDICATIONS: No current outpatient medications on file. (Ophthalmic Drugs)   No current facility-administered medications for this visit.  (Ophthalmic Drugs)   Current Outpatient Medications (Other)  Medication Sig  . Azilsartan Medoxomil (EDARBI) 40 MG TABS Take 40 mg by mouth daily.   . benzonatate (TESSALON) 100 MG capsule Take 1 capsule (100 mg total) by mouth every 8 (eight) hours.  . Cholecalciferol (VITAMIN D3) 400 units CAPS Take by mouth.  . Coenzyme Q10 (COQ10) 200 MG CAPS Take by mouth.  . Docosahexaenoic Acid (OMEGA-3 DHA) POWD Take by mouth.  . esomeprazole (NEXIUM) 20 MG capsule Take 20 mg by mouth daily.   . fluticasone (FLONASE) 50 MCG/ACT nasal spray Place into the nose as directed.  Marland Kitchen guaifenesin (ROBITUSSIN) 100 MG/5ML syrup Take 10 mLs (200 mg total) by mouth every 4 (four) hours as  needed for cough or congestion.  . hydrochlorothiazide (MICROZIDE) 12.5 MG capsule Take 12.5 mg by mouth daily as needed. When BP is elevated  . LORazepam (ATIVAN) 1 MG tablet Take 1 mg by mouth daily as needed. Rare use  . medium chain triglycerides oil (MCT OIL) OIL Take by mouth.  . Multiple Vitamins-Minerals (MULTIVITAMIN ADULT PO) Take by mouth.  . OLIVE LEAF PO Take by mouth.   No current facility-administered medications for this visit.  (Other)      REVIEW OF SYSTEMS: ROS    Positive for: Genitourinary, Eyes   Negative for: Constitutional, Gastrointestinal, Neurological, Skin, Musculoskeletal, HENT, Endocrine, Cardiovascular, Respiratory, Psychiatric, Allergic/Imm, Heme/Lymph   Last edited by Roselee Nova D on 07/14/2018  1:03 PM. (History)       ALLERGIES Allergies  Allergen Reactions  . Codeine Nausea Only  . Prochlorperazine     Jaw tightness    PAST MEDICAL HISTORY Past Medical History:  Diagnosis Date  . Anxiety   . Bronchitis   . GERD (gastroesophageal reflux disease)   . Hypertension   . Pneumonia   . Sleep apnea    Past Surgical History:  Procedure Laterality Date  . BREAST SURGERY    . CESAREAN SECTION    . IRIDOTOMY / IRIDECTOMY Bilateral   . LPI Bilateral   . OVARY SURGERY      FAMILY HISTORY Family History  Problem Relation Age  of Onset  . Glaucoma Mother     SOCIAL HISTORY Social History   Tobacco Use  . Smoking status: Never Smoker  . Smokeless tobacco: Never Used  Substance Use Topics  . Alcohol use: Not Currently  . Drug use: Never         OPHTHALMIC EXAM:  Base Eye Exam    Visual Acuity (Snellen - Linear)      Right Left   Dist cc 20/30 +2 20/25   Dist ph cc 20/25 -2 20/20 -2   Correction:  Glasses       Tonometry (Tonopen, 1:16 PM)      Right Left   Pressure 15 15       Pupils      Dark Light Shape React APD   Right 4 3 Round Slow None   Left 4 3 Round Slow None       Visual Fields (Counting fingers)       Left Right    Full Full       Extraocular Movement      Right Left    Full, Ortho Full, Ortho       Neuro/Psych    Oriented x3:  Yes   Mood/Affect:  Normal       Dilation    Both eyes:  1.0% Mydriacyl, 2.5% Phenylephrine @ 1:16 PM        Slit Lamp and Fundus Exam    Slit Lamp Exam      Right Left   Lids/Lashes Normal Normal   Conjunctiva/Sclera White and quiet White and quiet   Cornea Arcus Arcus   Anterior Chamber Deep and quiet, narrow temporal angle Deep and quiet, narrow temporal angle   Iris Round and well dilated, patent LPI at 1200 Round and well dilated, patent LPI at 0100   Lens 2+ Nuclear sclerosis, 3+ Cortical cataract, 1+ Posterior subcapsular cataract 2+ Nuclear sclerosis, 3+ Cortical cataract, trace PSC   Vitreous mild Vitreous syneresis mild Vitreous syneresis       Fundus Exam      Right Left   Disc Pink and Sharp sharp rim, temp PPP   C/D Ratio 0.3 0.5   Macula Good foveal reflex, Retinal pigment epithelial mottling, Drusen, No heme or edema Flat, Good foveal reflex, Drusen, Retinal pigment epithelial mottling, No heme or edema   Vessels Vascular attenuation, mild AV crossing changes Vascular attenuation, mild AV crossing changes   Periphery Attached; small, relatively flat choroidal nevus inf temp edge of macula with overlying drusen -- no SRF or orange pigment Attached           Refraction    Wearing Rx      Sphere Cylinder Axis Add   Right +4.50 sph   +2.50   Left +3.00 +1.25 032 +2.50   Type:  PAL          IMAGING AND PROCEDURES  Imaging and Procedures for @TODAY @  OCT, Retina - OU - Both Eyes       Right Eye Quality was good. Central Foveal Thickness: 259. Progression has been stable. Findings include normal foveal contour, no IRF, no SRF, retinal drusen , vitreomacular adhesion  (Central RPE prominence -- stable from prior).   Left Eye Central Foveal Thickness: 259. Progression has been stable. Findings include normal foveal  contour, no SRF, no IRF, vitreomacular adhesion , retinal drusen  (Central RPE  Prominence -- stable from prior).   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 CME - cystoid macular edema. PED - pigment epithelial detachment. IRF - intraretinal fluid. SRF - subretinal fluid. EZ - ellipsoid zone. ERM - epiretinal membrane. ORA - outer retinal atrophy. ORT - outer retinal tubulation. SRHM - subretinal hyper-reflective material        Fluorescein Angiography Optos (Transit OD)       Right Eye   Early phase findings include normal observations. Mid/Late phase findings include staining.   Left Eye   Early phase findings include normal observations. Mid/Late phase findings include normal observations.   Notes **Images stored on drive**  Impression: OD:  focal late staining, inf temporal macula -- no leakage OS:  Normal study                 ASSESSMENT/PLAN:    ICD-10-CM   1. 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 Retinal edema H35.81 OCT, Retina - OU - Both Eyes  3. 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 Essential hypertension I10   5. Hypertensive retinopathy of both eyes H35.033 Fluorescein Angiography Optos (Transit OD)  6. 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 Age related macular degeneration, non-exudative, both eyes   - early stage w/ prominent central druse OU vs mild vitelliform-like lesion  - The incidence, anatomy, and pathology of dry AMD, risk of progression, and the AREDS and AREDS 2 study including smoking risks discussed with patient.  - Recommend amsler grid monitoring  - differential  includes adult vitelliform dystrophy  - baseline FA 1.28.2020 -- no obvious CNV or leakage centrally OU  - f/u 6-9 months, DFE, OCT  2. No retinal edema on exam or OCT  3. Choroidal nevus OD - 1.5DD, relatively flat pigmented lesion, inf temporal edge of macula - +drusen - no SRF or orange pigment - +staining on FA 1.28.2020 - baseline Optos color image obtained - monitor  4,5. Hypertensive retinopathy OU - discussed importance of tight BP control - monitor  6. Anatomic narrow angles OU - s/p LPI OU -- patent - IOP okay - glaucoma work up and management per [facility]  7. Mixed Cataract OU - The symptoms of cataract, surgical options, and treatments and risks were discussed with patient. - discussed diagnosis and progression - not yet visually significant - monitor for now   Ophthalmic Meds Ordered this visit:  No orders of the defined types were placed in this encounter.      Return for 6-9 mos, Dilated Exam, OCT.  There are no Patient Instructions on file for this visit.   Explained the diagnoses, plan, and follow up with the patient and they expressed understanding.  Patient expressed understanding of the importance of proper follow up care.   This document serves as a record of services personally performed by Gardiner Sleeper, MD, PhD. It was created on their behalf by Ernest Mallick, OA, an ophthalmic assistant. The creation of this record is the provider's dictation and/or activities during the visit.    Electronically signed by: Ernest Mallick, OA 01.26.2020 11:50 AM     Gardiner Sleeper, M.D., Ph.D. Diseases & Surgery of the Retina and Vitreous [facility]   I have reviewed the above documentation for accuracy and completeness, and I agree with the above. Gardiner Sleeper, M.D., Ph.D. 07/15/18 11:50 AM     Abbreviations: M myopia (nearsighted); A astigmatism; H hyperopia (farsighted); P presbyopia; Mrx spectacle  prescription;  CTL contact lenses; OD right eye; OS left eye; OU both eyes  XT exotropia; ET esotropia; PEK punctate epithelial keratitis; PEE punctate epithelial erosions; DES dry eye syndrome; MGD meibomian gland dysfunction; ATs artificial tears; PFAT's preservative free artificial tears; [place] nuclear sclerotic cataract; PSC posterior subcapsular cataract; ERM epi-retinal membrane; PVD posterior vitreous detachment; RD retinal detachment; DM diabetes mellitus; DR diabetic retinopathy; NPDR non-proliferative diabetic retinopathy; PDR proliferative diabetic retinopathy; CSME clinically significant macular edema; DME diabetic macular edema; dbh dot blot hemorrhages; CWS cotton wool spot; POAG primary open angle glaucoma; C/D cup-to-disc ratio; HVF humphrey visual field; GVF goldmann visual field; OCT optical coherence tomography; IOP intraocular pressure; BRVO Branch retinal vein occlusion; CRVO central retinal vein occlusion; CRAO central retinal artery occlusion; BRAO branch retinal artery occlusion; RT retinal tear; SB scleral buckle; PPV pars plana vitrectomy; VH Vitreous hemorrhage; PRP panretinal laser photocoagulation; IVK intravitreal kenalog; VMT vitreomacular traction; MH Macular hole;  NVD neovascularization of the disc; NVE neovascularization elsewhere; AREDS age related eye disease study; ARMD age related macular degeneration; POAG primary open angle glaucoma; EBMD epithelial/anterior basement membrane dystrophy; ACIOL anterior chamber intraocular lens; IOL intraocular lens; PCIOL posterior chamber intraocular lens; Phaco/IOL phacoemulsification with intraocular lens placement; [place] photorefractive keratectomy; LASIK laser assisted in situ keratomileusis; HTN hypertension; DM diabetes mellitus; COPD chronic obstructive pulmonary disease

## 2018-07-14 ENCOUNTER — Encounter (INDEPENDENT_AMBULATORY_CARE_PROVIDER_SITE_OTHER): Payer: Self-pay | Admitting: Ophthalmology

## 2018-07-14 ENCOUNTER — Ambulatory Visit (INDEPENDENT_AMBULATORY_CARE_PROVIDER_SITE_OTHER): Payer: 59 | Admitting: Ophthalmology

## 2018-07-14 DIAGNOSIS — H25813 Combined forms of age-related cataract, bilateral: Secondary | ICD-10-CM

## 2018-07-14 DIAGNOSIS — H353131 Nonexudative age-related macular degeneration, bilateral, early dry stage: Secondary | ICD-10-CM

## 2018-07-14 DIAGNOSIS — H35033 Hypertensive retinopathy, bilateral: Secondary | ICD-10-CM | POA: Diagnosis not present

## 2018-07-14 DIAGNOSIS — I10 Essential (primary) hypertension: Secondary | ICD-10-CM

## 2018-07-14 DIAGNOSIS — D3131 Benign neoplasm of right choroid: Secondary | ICD-10-CM | POA: Diagnosis not present

## 2018-07-14 DIAGNOSIS — H3581 Retinal edema: Secondary | ICD-10-CM | POA: Diagnosis not present

## 2018-07-14 DIAGNOSIS — H40033 Anatomical narrow angle, bilateral: Secondary | ICD-10-CM

## 2018-07-29 ENCOUNTER — Ambulatory Visit: Payer: 59 | Admitting: Medical

## 2018-08-07 NOTE — Assessment & Plan Note (Signed)
Very well, sleeps better and continues to benefit from her ASV PAP. Plan- Continue ASV  EPAP 6, Min PS 5, Max PS 12

## 2018-08-07 NOTE — Assessment & Plan Note (Signed)
She tries to be attentive to good sleep habits.  Occasional Ativan is helpful without nighttime confusion or daytime carryover. Plan-conservative use of sedative-hypnotics discussed.

## 2018-08-19 LAB — COLOGUARD

## 2018-08-26 ENCOUNTER — Telehealth: Payer: Self-pay | Admitting: *Deleted

## 2018-08-26 NOTE — Telephone Encounter (Signed)
Received Cologuard Results; forwarded to provider/SLS 03/11

## 2018-09-03 ENCOUNTER — Telehealth: Payer: Self-pay | Admitting: Medical

## 2018-09-03 NOTE — Telephone Encounter (Signed)
Your cologuard test was negative. Recommend screening interval presently is 3 years. Please notify pt. Placed test for scanning.

## 2018-09-07 NOTE — Telephone Encounter (Signed)
Tired to call pt no vm. Okay for PEC to give information.

## 2018-09-28 ENCOUNTER — Telehealth: Payer: Self-pay

## 2018-09-28 DIAGNOSIS — G4739 Other sleep apnea: Secondary | ICD-10-CM

## 2018-09-28 NOTE — Telephone Encounter (Signed)
Pt wants to know if she is eligible for a new Bi-pap. DME-[facility].

## 2018-09-28 NOTE — Telephone Encounter (Signed)
Spoke with Kayla Hebert at [facility] and they stated she would not be eligible for another Bipap machine until 04/03/2021. Pt understood and nothing further is needed.    04/03/2021

## 2018-10-18 ENCOUNTER — Emergency Department (HOSPITAL_BASED_OUTPATIENT_CLINIC_OR_DEPARTMENT_OTHER)
Admission: EM | Admit: 2018-10-18 | Discharge: 2018-10-18 | Disposition: A | Discharge status: Home / Self Care | Payer: 59 | Attending: Emergency Medicine | Admitting: Emergency Medicine

## 2018-10-18 ENCOUNTER — Encounter (HOSPITAL_BASED_OUTPATIENT_CLINIC_OR_DEPARTMENT_OTHER): Payer: Self-pay | Admitting: Emergency Medicine

## 2018-10-18 ENCOUNTER — Other Ambulatory Visit: Payer: Self-pay

## 2018-10-18 ENCOUNTER — Emergency Department (HOSPITAL_BASED_OUTPATIENT_CLINIC_OR_DEPARTMENT_OTHER): Payer: 59

## 2018-10-18 DIAGNOSIS — W19XXXA Unspecified fall, initial encounter: Secondary | ICD-10-CM

## 2018-10-18 DIAGNOSIS — S6991XA Unspecified injury of right wrist, hand and finger(s), initial encounter: Secondary | ICD-10-CM | POA: Diagnosis present

## 2018-10-18 DIAGNOSIS — Z79899 Other long term (current) drug therapy: Secondary | ICD-10-CM | POA: Insufficient documentation

## 2018-10-18 DIAGNOSIS — S60221A Contusion of right hand, initial encounter: Secondary | ICD-10-CM | POA: Insufficient documentation

## 2018-10-18 DIAGNOSIS — Y929 Unspecified place or not applicable: Secondary | ICD-10-CM | POA: Diagnosis not present

## 2018-10-18 DIAGNOSIS — W010XXA Fall on same level from slipping, tripping and stumbling without subsequent striking against object, initial encounter: Secondary | ICD-10-CM | POA: Diagnosis not present

## 2018-10-18 DIAGNOSIS — S62650A Nondisplaced fracture of medial phalanx of right index finger, initial encounter for closed fracture: Secondary | ICD-10-CM | POA: Diagnosis not present

## 2018-10-18 DIAGNOSIS — I10 Essential (primary) hypertension: Secondary | ICD-10-CM | POA: Diagnosis not present

## 2018-10-18 DIAGNOSIS — Y999 Unspecified external cause status: Secondary | ICD-10-CM | POA: Diagnosis not present

## 2018-10-18 DIAGNOSIS — Y939 Activity, unspecified: Secondary | ICD-10-CM | POA: Insufficient documentation

## 2018-10-18 NOTE — Discharge Instructions (Signed)
You were seen in the emergency department for evaluation of a hand injury after a fall.  Although your x-ray is more definitive there is possibly a fracture on your index finger.  We are putting you in a splint to help immobilize that area.  You should use ice and can take Tylenol or ibuprofen/Aleve for pain.  We are including the number for an orthopedic hand doctor if your symptoms are not improved.  The splint is for comfort and you can take it off if it feels better without it.

## 2018-10-18 NOTE — ED Notes (Signed)
Ice applied

## 2018-10-18 NOTE — ED Provider Notes (Signed)
[facility] EMERGENCY DEPARTMENT Provider Note   CSN: [id number] Arrival date & time: 10/18/18  1141    History   Chief Complaint Chief Complaint  Patient presents with  . Fall    HPI Kayla Hebert is a 68 y.o. female.  Right-hand-dominant.  She had a mechanical fall yesterday where she tripped and broke her fall with her right hand.  Since then she has had severe pain in her right hand especially her first and fourth fingers.  Limited range of motion.  She rates the pain is severe and throbbing in nature and worse with any kind of movement or palpation.  Was somewhat improved with ice.  She said she has had an abrasion on her toe but otherwise no real complaints from the fall.     The history is provided by the patient.  Fall  This is a new problem. The current episode started yesterday. The problem has not changed since onset.Pertinent negatives include no chest pain, no abdominal pain, no headaches and no shortness of breath. The symptoms are aggravated by bending. The symptoms are relieved by ice. She has tried rest for the symptoms. The treatment provided mild relief.    Past Medical History:  Diagnosis Date  . Anxiety   . Bronchitis   . GERD (gastroesophageal reflux disease)   . Hypertension   . Pneumonia   . Sleep apnea     Patient Active Problem List   Diagnosis Date Noted  . Eustachian tube dysfunction, bilateral 03/24/2018  . Seasonal allergic rhinitis 03/24/2018  . 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 Insomnia 01/28/2018  . Hypertension, essential 01/28/2018  . Glaucoma 01/28/2018  . GERD (gastroesophageal reflux disease) 01/28/2018  . Hypercholesteremia 01/24/2017  . Multiple thyroid nodules 09/26/2016  . Chronic cough 06/07/2015  . Generalized anxiety disorder 06/07/2015  . Other fatigue 06/07/2015  . PND (post-nasal drip) 06/07/2015    Past Surgical History:  Procedure Laterality Date  . BREAST SURGERY     . CESAREAN SECTION    . IRIDOTOMY / IRIDECTOMY Bilateral   . LPI Bilateral   . OVARY SURGERY       OB History   No obstetric history on file.      Home Medications    Prior to Admission medications   Medication Sig Start Date End Date Taking? Authorizing Provider  Azilsartan Medoxomil (EDARBI) 40 MG TABS Take 40 mg by mouth daily.  11/24/17   [provider]  benzonatate (TESSALON) 100 MG capsule Take 1 capsule (100 mg total) by mouth every 8 (eight) hours. 06/17/18   Domenic Moras, PA-C  Cholecalciferol (VITAMIN D3) 400 units CAPS Take by mouth.    [provider]  Coenzyme Q10 (COQ10) 200 MG CAPS Take by mouth.    [provider]  Docosahexaenoic Acid (OMEGA-3 DHA) POWD Take by mouth.    [provider]  esomeprazole (NEXIUM) 20 MG capsule Take 20 mg by mouth daily.     [provider]  fluticasone (FLONASE) 50 MCG/ACT nasal spray Place into the nose as directed. 03/24/18   [provider]  guaifenesin (ROBITUSSIN) 100 MG/5ML syrup Take 10 mLs (200 mg total) by mouth every 4 (four) hours as needed for cough or congestion. 06/17/18   Domenic Moras, PA-C  hydrochlorothiazide (MICROZIDE) 12.5 MG capsule Take 12.5 mg by mouth daily as needed. When BP is elevated 11/25/17   [provider]  LORazepam (ATIVAN) 1 MG tablet Take 1  mg by mouth daily as needed. Rare use 01/03/18   [provider]  medium chain triglycerides oil (MCT OIL) OIL Take by mouth.    [provider]  Multiple Vitamins-Minerals (MULTIVITAMIN ADULT PO) Take by mouth.    [provider]  OLIVE LEAF PO Take by mouth.    [provider]    Family History Family History  Problem Relation Age of Onset  . Glaucoma Mother     Social History Social History   Tobacco Use  . Smoking status: Never Smoker  . Smokeless tobacco: Never Used  Substance Use Topics  . Alcohol use: Not Currently  . Drug use: Never     Allergies    Codeine and Prochlorperazine   Review of Systems Review of Systems  Constitutional: Negative for fever.  HENT: Negative for sore throat.   Respiratory: Negative for shortness of breath.   Cardiovascular: Negative for chest pain.  Gastrointestinal: Negative for abdominal pain.  Genitourinary: Negative for dysuria.  Musculoskeletal: Positive for joint swelling.  Skin: Negative for rash.  Neurological: Negative for headaches.     Physical Exam Updated Vital Signs BP 127/72 (BP Location: Right Arm)   Pulse 70   Temp 99 F (37.2 C) (Oral)   Resp 18   Ht 5\' 2"  (1.575 m)   Wt 73.5 kg   SpO2 98%   BMI 29.63 kg/m   Physical Exam Constitutional:      Appearance: She is well-developed.  HENT:     Head: Normocephalic and atraumatic.  Eyes:     Conjunctiva/sclera: Conjunctivae normal.  Neck:     Musculoskeletal: Neck supple.  Musculoskeletal:     Comments: Right upper extremity full range of motion of shoulder elbow and wrist.  Limitations of flexion extension of right digits especially second and fourth.  Diffuse tenderness of digits but primarily second PIP and fourth PIP.  Thumb nontender snuffbox negative.  Cap refill and sensory intact.  Skin:    General: Skin is warm and dry.     Capillary Refill: Capillary refill takes less than 2 seconds.  Neurological:     General: No focal deficit present.     Mental Status: She is alert and oriented to person, place, and time.     GCS: GCS eye subscore is 4. GCS verbal subscore is 5. GCS motor subscore is 6.     Sensory: No sensory deficit.      ED Treatments / Results  Labs (all labs ordered are listed, but only abnormal results are displayed) Labs Reviewed - No data to display  EKG None  Radiology Dg Hand Complete Right  Addendum Date: 10/18/2018   ADDENDUM REPORT: 10/18/2018 12:29 ADDENDUM: Irregularly at the volar base of the second middle phalanx without displacement likely reflecting an age indeterminate injury.  Injury is felt to reflect a chronic injury given the chronic deformity of left fifth metacarpal, but acute/subacute injury cannot be excluded. Correlate with point tenderness. Electronically Signed   By: Kathreen Devoid   On: 10/18/2018 12:29   Result Date: 10/18/2018 CLINICAL DATA:  Status post fall right hand pain EXAM: RIGHT HAND - COMPLETE 3+ VIEW COMPARISON:  None. FINDINGS: Generalized osteopenia. No acute fracture or dislocation. No aggressive osseous lesion. Mild osteoarthritis of the DIP joints. Mild osteoarthritis of first CMC joint. IMPRESSION: No acute osseous injury of the right hand. Electronically Signed: By: Kathreen Devoid On: 10/18/2018 12:18    Procedures Procedures (including critical care time)  Medications Ordered in  ED Medications - No data to display   Initial Impression / Assessment and Plan / ED Course  I have reviewed the triage vital signs and the nursing notes.  Pertinent labs & imaging results that were available during my care of the patient were reviewed by me and considered in my medical decision making (see chart for details).  Clinical Course as of Oct 18 1350  Sun Oct 18, 2018  1154 Differential diagnosis includes fracture, dislocation, sprain, contusion.   [MB]  1228 X-ray reading no acute fracture versus possible at the base of her middle phalanx of her index finger.  Clinically she is tender there and so I think it be reasonable to splint her and have her follow-up with hand.  Reviewed all this with the patient and answered her questions the best my ability.  X-rays were reviewed by me and I spoke to the radiologist to confirm findings.   [MB]  1252 Splint applied by tech.  I reviewed this and she has normal sensation and cap refill after application.   [MB]    Clinical Course User Index [MB] Hayden Rasmussen, MD        Final Clinical Impressions(s) / ED Diagnoses   Final diagnoses:  Nondisplaced fracture of middle phalanx of right index finger,  initial encounter for closed fracture  Contusion of right hand, initial encounter  Fall, initial encounter    ED Discharge Orders    None       Hayden Rasmussen, MD 10/18/18 1353

## 2018-10-18 NOTE — ED Triage Notes (Signed)
Pt tripped and fell yesterday. C/o R hand injury. Swelling noted to ring finger.

## 2019-02-01 NOTE — Progress Notes (Signed)
[facility] Note  02/02/2019     CHIEF COMPLAINT Patient presents for Retina Follow Up   HISTORY OF PRESENT ILLNESS: Kayla Hebert is a 68 y.o. female who presents to the clinic today for:   HPI    Retina Follow Up    Patient presents with  Dry AMD.  In both eyes.  This started months ago.  Severity is moderate.  Duration of months.  Since onset it is stable.  I, the attending physician,  performed the HPI with the patient and updated documentation appropriately.          Comments    Patient states her vision is about the same.  She denies eye pain or discomfort and denies new or worsening floaters or fol.   Patient made an appt with an Optometrist for this week but cannot remember the name of the practice or doctor.       Last edited by Bernarda Caffey, MD on 02/02/2019  1:40 PM. (History)    pt states she feels like she looks at a computer too many hours a day, she states she feels she has times where her eyes get very blurry, she states she noticed today while she was reading the eye chart that her left eye vision is much darker than her right eye, pt says she is still taking AREDS 2 vits   Mackie Pai, PA-C [address] [place],  [place] [address]  HISTORICAL INFORMATION:   Selected notes from the MEDICAL RECORD NUMBER Referred by Dr. 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 Hallahan) [BCVA: OD: 20/20- OS: 20/20-] Ocular Hx-ANA OU, Uveitis OD, SLT OU, Iriditomy OU, Glaucoma suspect, choroidal nevus OD, cataract OU PMH-HTN    CURRENT MEDICATIONS: No current outpatient medications on file. (Ophthalmic Drugs)   No current facility-administered medications for this visit.  (Ophthalmic Drugs)   Current Outpatient Medications (Other)  Medication Sig  . Azilsartan Medoxomil (EDARBI) 40 MG TABS Take 40 mg by mouth daily.   . benzonatate (TESSALON) 100 MG capsule Take 1 capsule (100 mg  total) by mouth every 8 (eight) hours.  . Cholecalciferol (VITAMIN D3) 400 units CAPS Take by mouth.  . Coenzyme Q10 (COQ10) 200 MG CAPS Take by mouth.  . Docosahexaenoic Acid (OMEGA-3 DHA) POWD Take by mouth.  . esomeprazole (NEXIUM) 20 MG capsule Take 20 mg by mouth daily.   . fluticasone (FLONASE) 50 MCG/ACT nasal spray Place into the nose as directed.  Marland Kitchen guaifenesin (ROBITUSSIN) 100 MG/5ML syrup Take 10 mLs (200 mg total) by mouth every 4 (four) hours as needed for cough or congestion.  . hydrochlorothiazide (MICROZIDE) 12.5 MG capsule Take 12.5 mg by mouth daily as needed. When BP is elevated  . LORazepam (ATIVAN) 1 MG tablet Take 1 mg by mouth daily as needed. Rare use  . medium chain triglycerides oil (MCT OIL) OIL Take by mouth.  . Multiple Vitamins-Minerals (MULTIVITAMIN ADULT PO) Take by mouth.  . OLIVE LEAF PO Take by mouth.   No current facility-administered medications for this visit.  (Other)      REVIEW OF SYSTEMS: ROS    Positive for: Genitourinary, Eyes   Negative for: Constitutional, Gastrointestinal, Neurological, Skin, Musculoskeletal, HENT, Endocrine, Cardiovascular, Respiratory, Psychiatric, Allergic/Imm, Heme/Lymph   Last edited by Doneen Poisson on 02/02/2019  1:07 PM. (History)       ALLERGIES Allergies  Allergen Reactions  . Codeine Nausea Only  .  Prochlorperazine     Jaw tightness    PAST MEDICAL HISTORY Past Medical History:  Diagnosis Date  . Anxiety   . Bronchitis   . GERD (gastroesophageal reflux disease)   . Hypertension   . Pneumonia   . Sleep apnea    Past Surgical History:  Procedure Laterality Date  . BREAST SURGERY    . CESAREAN SECTION    . IRIDOTOMY / IRIDECTOMY Bilateral   . LPI Bilateral   . OVARY SURGERY      FAMILY HISTORY Family History  Problem Relation Age of Onset  . Glaucoma Mother     SOCIAL HISTORY Social History   Tobacco Use  . Smoking status: Never Smoker  . Smokeless tobacco: Never Used   Substance Use Topics  . Alcohol use: Not Currently  . Drug use: Never         OPHTHALMIC EXAM:  Base Eye Exam    Visual Acuity (Snellen - Linear)      Right Left   Dist cc 20/25 -3 20/20 -3   Dist ph cc 20/20 -2        Pupils      Dark Light Shape React APD   Right 3 2 Round Brisk 0   Left 3 2 Round Brisk 0       Visual Fields      Left Right    Full Full       Extraocular Movement      Right Left    Full Full       Neuro/Psych    Oriented x3: Yes   Mood/Affect: Normal        Slit Lamp and Fundus Exam    Slit Lamp Exam      Right Left   Lids/Lashes Normal Normal   Conjunctiva/Sclera White and quiet White and quiet   Cornea Arcus, 1+ Punctate epithelial erosions Arcus, 1+ Punctate epithelial erosions   Anterior Chamber Deep and quiet, narrow temporal angle Deep and quiet, narrow temporal angle   Iris Round and well dilated, patent LPI at 1200 Round and well dilated, patent LPI at 0100   Lens 2-3+ Nuclear sclerosis, 3+ Cortical cataract, 1+ Posterior subcapsular cataract 2-3+ Nuclear sclerosis, 3+ Cortical cataract, trace PSC   Vitreous mild Vitreous syneresis mild Vitreous syneresis       Fundus Exam      Right Left   Disc Pink and Sharp sharp rim, temp PPP, pink and sharp   C/D Ratio 0.3 0.5   Macula Flat, blunted foveal reflex, Retinal pigment epithelial mottling, Drusen, No heme or edema Flat, blunted foveal reflex, Drusen, Retinal pigment epithelial mottling, No heme or edema   Vessels Vascular attenuation, mild AV crossing changes Vascular attenuation, mild Tortuousity   Periphery Attached; small, relatively flat choroidal nevus inf temp edge of macula with overlying drusen -- no SRF or orange pigment -- stable Attached           Refraction    Wearing Rx      Sphere Cylinder Axis Add   Right +4.50 Sphere  +2.50   Left +3.00 +1.25 032 +2.50   Type: PAL          IMAGING AND PROCEDURES  Imaging and Procedures for @TODAY @  OCT, Retina - OU  - Both Eyes       Right Eye Quality was good. Central Foveal Thickness: 270. Progression has been stable. Findings include normal foveal contour, no IRF, no SRF, retinal drusen , vitreomacular adhesion  (  Central RPE prominence -- stable from prior).   Left Eye Central Foveal Thickness: 261. Progression has been stable. Findings include normal foveal contour, no SRF, no IRF, vitreomacular adhesion , retinal drusen  (Central RPE Prominence -- stable from prior).   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 CME - cystoid macular edema. PED - pigment epithelial detachment. IRF - intraretinal fluid. SRF - subretinal fluid. EZ - ellipsoid zone. ERM - epiretinal membrane. ORA - outer retinal atrophy. ORT - outer retinal tubulation. SRHM - subretinal hyper-reflective material                 ASSESSMENT/PLAN:    ICD-10-CM   1. Early dry stage nonexudative age-related macular degeneration of both eyes  H35.3131   2. Retinal edema  H35.81 OCT, Retina - OU - Both Eyes  3. Nevus of choroid of right eye  D31.31   4. Essential hypertension  I10   5. Hypertensive retinopathy of both eyes  H35.033   6. Anatomical narrow angle of both eyes  H40.033   7. Combined forms of age-related cataract of both eyes  H25.813     1. Age related macular degeneration, non-exudative, both eyes   - early stage w/ prominent central druse OU vs mild vitelliform-like lesion -- stable from prior  - The incidence, anatomy, and pathology of dry AMD, risk of progression, and the AREDS and AREDS 2 study including smoking risks discussed with patient.  - Recommend amsler grid monitoring  - differential includes adult vitelliform dystrophy  - baseline FA 1.28.2020 -- no obvious CNV  or leakage centrally OU  - f/u 9 months, DFE, OCT  2. No retinal edema on exam or OCT  3. Choroidal nevus OD -- stable  - 1.5DD, relatively flat pigmented lesion, inf temporal edge of macula  - +drusen  - no SRF or orange pigment  - +staining on FA 1.28.2020  - baseline Optos color image obtained  - monitor  4,5. Hypertensive retinopathy OU  - discussed importance of tight BP control  - monitor  6. Anatomic narrow angles OU  - s/p LPI OU -- patent  - IOP okay  - glaucoma work up and management per [facility]  7. Mixed Cataract OU  - The symptoms of cataract, surgical options, and treatments and risks were discussed with patient.  - discussed diagnosis and progression  - not yet visually significant  - monitor for now   Ophthalmic Meds Ordered this visit:  No orders of the defined types were placed in this encounter.      Return in about 9 months (around 11/02/2019) for f/u non-exu ARMD OU, DFE, OCT.  There are no Patient Instructions on file for this visit.   Explained the diagnoses, plan, and follow up with the patient and they expressed understanding.  Patient expressed understanding of the importance of proper follow up care.   This document serves as a record of services personally performed by Gardiner Sleeper, MD, PhD. It was created on their behalf by Ernest Mallick, OA, an ophthalmic assistant. The creation of this record is the provider's dictation and/or activities during the visit.    Electronically signed by: Ernest Mallick, OA 08.17.2020 9:31 PM     Gardiner Sleeper, M.D., Ph.D. Diseases & Surgery  of the Retina and Vitreous [facility]  I have reviewed the above documentation for accuracy and completeness, and I agree with the above. Gardiner Sleeper, M.D., Ph.D. 02/02/19 9:32 PM    Abbreviations: M myopia (nearsighted); A astigmatism; H hyperopia (farsighted); P presbyopia; Mrx spectacle prescription;  CTL contact lenses;  OD right eye; OS left eye; OU both eyes  XT exotropia; ET esotropia; PEK punctate epithelial keratitis; PEE punctate epithelial erosions; DES dry eye syndrome; MGD meibomian gland dysfunction; ATs artificial tears; PFAT's preservative free artificial tears; [place] nuclear sclerotic cataract; PSC posterior subcapsular cataract; ERM epi-retinal membrane; PVD posterior vitreous detachment; RD retinal detachment; DM diabetes mellitus; DR diabetic retinopathy; NPDR non-proliferative diabetic retinopathy; PDR proliferative diabetic retinopathy; CSME clinically significant macular edema; DME diabetic macular edema; dbh dot blot hemorrhages; CWS cotton wool spot; POAG primary open angle glaucoma; C/D cup-to-disc ratio; HVF humphrey visual field; GVF goldmann visual field; OCT optical coherence tomography; IOP intraocular pressure; BRVO Branch retinal vein occlusion; CRVO central retinal vein occlusion; CRAO central retinal artery occlusion; BRAO branch retinal artery occlusion; RT retinal tear; SB scleral buckle; PPV pars plana vitrectomy; VH Vitreous hemorrhage; PRP panretinal laser photocoagulation; IVK intravitreal kenalog; VMT vitreomacular traction; MH Macular hole;  NVD neovascularization of the disc; NVE neovascularization elsewhere; AREDS age related eye disease study; ARMD age related macular degeneration; POAG primary open angle glaucoma; EBMD epithelial/anterior basement membrane dystrophy; ACIOL anterior chamber intraocular lens; IOL intraocular lens; PCIOL posterior chamber intraocular lens; Phaco/IOL phacoemulsification with intraocular lens placement; [place] photorefractive keratectomy; LASIK laser assisted in situ keratomileusis; HTN hypertension; DM diabetes mellitus; COPD chronic obstructive pulmonary disease

## 2019-02-02 ENCOUNTER — Encounter (INDEPENDENT_AMBULATORY_CARE_PROVIDER_SITE_OTHER): Payer: Self-pay | Admitting: Ophthalmology

## 2019-02-02 ENCOUNTER — Ambulatory Visit (INDEPENDENT_AMBULATORY_CARE_PROVIDER_SITE_OTHER): Payer: 59 | Admitting: Ophthalmology

## 2019-02-02 ENCOUNTER — Other Ambulatory Visit: Payer: Self-pay

## 2019-02-02 DIAGNOSIS — I10 Essential (primary) hypertension: Secondary | ICD-10-CM

## 2019-02-02 DIAGNOSIS — H353131 Nonexudative age-related macular degeneration, bilateral, early dry stage: Secondary | ICD-10-CM

## 2019-02-02 DIAGNOSIS — H3581 Retinal edema: Secondary | ICD-10-CM | POA: Diagnosis not present

## 2019-02-02 DIAGNOSIS — H25813 Combined forms of age-related cataract, bilateral: Secondary | ICD-10-CM

## 2019-02-02 DIAGNOSIS — D3131 Benign neoplasm of right choroid: Secondary | ICD-10-CM | POA: Diagnosis not present

## 2019-02-02 DIAGNOSIS — H35033 Hypertensive retinopathy, bilateral: Secondary | ICD-10-CM

## 2019-02-02 DIAGNOSIS — H40033 Anatomical narrow angle, bilateral: Secondary | ICD-10-CM

## 2019-02-28 NOTE — Progress Notes (Signed)
@Patient  ID: Kayla Hebert, female    DOB: 12-18-1950, 68 y.o.   MRN: [id number]  Chief Complaint  Patient presents with  . Follow-up    CPAP follow up    Referring provider: Elise Benne  HPI:  68 year old female never smoker followed in our office for mixed severe obstructive sleep apnea  PMH: High blood pressure, thyroid nodules, anxiety, GERD, glaucoma, ENT surgery for tonsils as a child Smoker/ Smoking History: Never smoker Maintenance:   Pt of: Dr. Annamaria Boots  Of note: Pt started CPAP in [place] and was changed quickly to BiPAP for mixed OSA.  She feels much better off with her Pap device which prevents gasping and prevents morning headaches.  03/01/2019  - Visit   68 year old female never smoker followed in our office for mixed severe obstructive sleep apnea.  Patient presenting today for her yearly follow-up visit.  Patient is currently managed on PAP therapy-ASV.  Compliance report below shows adequate compliance.  See report below:  01/27/2019-02/25/2019 20-24 out of last 30 days use, 22 of those days greater than 4 hours, average usage 6 hours and 21 minutes, ASV mode, EPAP 6, minimum pressure 5, max pressure 12, 95th percentile 6.7, AHI 0.6  Patient reports that overall she has been doing well and feels that she is at her baseline.  She does not like wearing her a ASV PAP but knows this is the best intervention for her at this time.  She will continue to wear it.  She does report that her DME company which is [employer] accidentally sent her nasal pillow mask instead of a nasal mask.  She would like to have these replaced as she prefers the traditional nasal mask not the pillows.  Patient refused high-dose flu vaccine today.   Tests:   NPSG 10/25/14 [place] AHI 40.6/ hr (Central 102, Obst 40, hypopneas 171), Desaturation to 83%, body weight   CPAP titration 08/01/15- [facility] ASV for Central predominant Mixed OSA   FENO:   No results found for: NITRICOXIDE  PFT: No flowsheet data found.  Imaging: No results found.    Specialty Problems      Pulmonary Problems   Chronic cough    CXR ordered on 06/07/2015.  Last Assessment & Plan:  Prescribe Ventolin.      Mixed sleep apnea     NPSG 10/25/14 [address] AHI 40.6/ hr (Central 102, Obst 40, hypopneas 171),      Seasonal allergic rhinitis      Allergies  Allergen Reactions  . Codeine Nausea Only  . Prochlorperazine     Jaw tightness     There is no immunization history on file for this patient.  Past Medical History:  Diagnosis Date  . Anxiety   . Bronchitis   . GERD (gastroesophageal reflux disease)   . Hypertension   . Pneumonia   . Sleep apnea     Tobacco History: Social History   Tobacco Use  Smoking Status Never Smoker  Smokeless Tobacco Never Used   Counseling given: Not Answered   Continue to not smoke  Outpatient Encounter Medications as of 03/01/2019  Medication Sig  . Azilsartan Medoxomil (EDARBI) 40 MG TABS Take 40 mg by mouth daily.   . Cholecalciferol (VITAMIN D3) 400 units CAPS Take by mouth.  . Coenzyme Q10 (COQ10) 200 MG CAPS Take by mouth.  . Docosahexaenoic Acid (OMEGA-3 DHA) POWD Take by mouth.  . esomeprazole (NEXIUM) 20 MG capsule  Take 40 mg by mouth 2 (two) times daily before a meal.   . hydrochlorothiazide (MICROZIDE) 12.5 MG capsule Take 12.5 mg by mouth daily as needed. When BP is elevated  . LORazepam (ATIVAN) 1 MG tablet Take 1 mg by mouth daily as needed. Rare use  . medium chain triglycerides oil (MCT OIL) OIL Take by mouth.  . Multiple Vitamins-Minerals (MULTIVITAMIN ADULT PO) Take by mouth.  . OLIVE LEAF PO Take by mouth.  . benzonatate (TESSALON) 100 MG capsule Take 1 capsule (100 mg total) by mouth every 8 (eight) hours. (Patient not taking: Reported on 03/01/2019)  . guaifenesin (ROBITUSSIN) 100 MG/5ML syrup Take 10 mLs (200 mg total) by mouth every 4 (four) hours as needed  for cough or congestion. (Patient not taking: Reported on 03/01/2019)  . [DISCONTINUED] fluticasone (FLONASE) 50 MCG/ACT nasal spray Place into the nose as directed.   No facility-administered encounter medications on file as of 03/01/2019.      Review of Systems  Review of Systems  Constitutional: Positive for fatigue (Baseline). Negative for activity change and fever.  HENT: Negative for sinus pressure, sinus pain and sore throat.   Respiratory: Negative for cough, shortness of breath and wheezing.   Cardiovascular: Negative for chest pain, palpitations and leg swelling.  Gastrointestinal: Negative for diarrhea, nausea and vomiting.  Musculoskeletal: Negative for arthralgias.  Neurological: Negative for dizziness and headaches (Managed well with ASV).  Psychiatric/Behavioral: Positive for sleep disturbance (Still wakes up despite ASV therapy). The patient is not nervous/anxious.      Physical Exam  BP 110/60 (BP Location: Left Arm, Cuff Size: Normal)   Pulse 86   Ht 5' 1.42" (1.56 m)   Wt 165 lb 6.4 oz (75 kg)   SpO2 98%   BMI 30.83 kg/m   Wt Readings from Last 5 Encounters:  03/01/19 165 lb 6.4 oz (75 kg)  10/18/18 162 lb (73.5 kg)  06/25/18 162 lb 6.4 oz (73.7 kg)  06/17/18 160 lb (72.6 kg)  06/01/18 158 lb 6.4 oz (71.8 kg)     Physical Exam Vitals signs and nursing note reviewed.  Constitutional:      General: She is not in acute distress.    Appearance: Normal appearance. She is normal weight.  HENT:     Head: Normocephalic and atraumatic.     Right Ear: Tympanic membrane, ear canal and external ear normal. There is no impacted cerumen.     Left Ear: Tympanic membrane, ear canal and external ear normal. There is no impacted cerumen.     Nose: Nose normal. No congestion or rhinorrhea.     Mouth/Throat:     Mouth: Mucous membranes are moist.     Pharynx: Oropharynx is clear.  Eyes:     Pupils: Pupils are equal, round, and reactive to light.  Neck:      Musculoskeletal: Normal range of motion.  Cardiovascular:     Rate and Rhythm: Normal rate and regular rhythm.     Pulses: Normal pulses.     Heart sounds: Normal heart sounds. No murmur.  Pulmonary:     Effort: Pulmonary effort is normal. No respiratory distress.     Breath sounds: Normal breath sounds. No decreased air movement. No decreased breath sounds, wheezing or rales.  Musculoskeletal:     Right lower leg: No edema.     Left lower leg: No edema.  Skin:    General: Skin is warm and dry.     Capillary Refill: Capillary refill takes  less than 2 seconds.  Neurological:     General: No focal deficit present.     Mental Status: She is alert and oriented to person, place, and time. Mental status is at baseline.     Gait: Gait normal.  Psychiatric:        Mood and Affect: Mood normal.        Behavior: Behavior normal.        Thought Content: Thought content normal.        Judgment: Judgment normal.      Lab Results:  CBC    Component Value Date/Time   WBC 4.9 06/26/2018 0914   RBC 4.36 06/26/2018 0914   HGB 13.1 06/26/2018 0914   HCT 38.1 06/26/2018 0914   PLT 185.0 06/26/2018 0914   MCV 87.4 06/26/2018 0914   MCHC 34.2 06/26/2018 0914   RDW 13.0 06/26/2018 0914   LYMPHSABS 1.7 06/26/2018 0914   MONOABS 0.4 06/26/2018 0914   EOSABS 0.1 06/26/2018 0914   BASOSABS 0.0 06/26/2018 0914    BMET    Component Value Date/Time   NA 141 06/26/2018 0914   K 4.7 06/26/2018 0914   CL 105 06/26/2018 0914   CO2 28 06/26/2018 0914   GLUCOSE 108 (H) 06/26/2018 0914   BUN 15 06/26/2018 0914   CREATININE 0.78 06/26/2018 0914   CALCIUM 10.1 06/26/2018 0914    BNP No results found for: BNP  ProBNP No results found for: PROBNP    Assessment & Plan:   Mixed sleep apnea Plan:  Continue ASV PAP as ordered  Needs nasal mask replace   Healthcare maintenance Offered high-dose flu vaccine today, patient declined    Return in about 1 year (around 02/29/2020), or if  symptoms worsen or fail to improve, for Follow up with Dr. Annamaria Boots.   Lauraine Rinne, NP 03/01/2019   This appointment was 16 minutes long with over 50% of the time in direct face-to-face patient care, assessment, plan of care, and follow-up.

## 2019-03-01 ENCOUNTER — Encounter: Payer: Self-pay | Admitting: Pulmonary Disease

## 2019-03-01 ENCOUNTER — Other Ambulatory Visit: Payer: Self-pay

## 2019-03-01 ENCOUNTER — Ambulatory Visit (INDEPENDENT_AMBULATORY_CARE_PROVIDER_SITE_OTHER): Payer: 59 | Admitting: Pulmonary Disease

## 2019-03-01 VITALS — BP 110/60 | HR 86 | Ht 61.42 in | Wt 165.4 lb

## 2019-03-01 DIAGNOSIS — G4739 Other sleep apnea: Secondary | ICD-10-CM | POA: Diagnosis not present

## 2019-03-01 DIAGNOSIS — Z0000 Encounter for general adult medical examination without abnormal findings: Secondary | ICD-10-CM | POA: Diagnosis not present

## 2019-03-01 NOTE — Assessment & Plan Note (Addendum)
Plan:  Continue ASV PAP as ordered  Needs nasal mask replace

## 2019-03-01 NOTE — Assessment & Plan Note (Signed)
Offered high-dose flu vaccine today, patient declined

## 2019-03-01 NOTE — Patient Instructions (Addendum)
You were seen today by Lauraine Rinne, NP  for:   1. Mixed sleep apnea  Continue ASV - PAP   >>>Keep up the hard work using your device >>> Goal should be wearing this for the entire night that you are sleeping, at least 4 to 6 hours  Remember:  . Do not drive or operate heavy machinery if tired or drowsy.  . Please notify the supply company and office if you are unable to use your device regularly due to missing supplies or machine being broken.  . Work on maintaining a healthy weight and following your recommended nutrition plan  . Maintain proper daily exercise and movement  . Maintaining proper use of your device can also help improve management of other chronic illnesses such as: Blood pressure, blood sugars, and weight management.   BiPAP/ CPAP Cleaning:  >>>Clean weekly, with Dawn soap, and bottle brush.  Set up to air dry.      Follow Up:    Return in about 1 year (around 02/29/2020), or if symptoms worsen or fail to improve, for Follow up with Dr. Annamaria Boots.   Please do your part to reduce the spread of COVID-19:      Reduce your risk of any infection  and COVID19 by using the similar precautions used for avoiding the common cold or flu:  Marland Kitchen Wash your hands often with soap and warm water for at least 20 seconds.  If soap and water are not readily available, use an alcohol-based hand sanitizer with at least 60% alcohol.  . If coughing or sneezing, cover your mouth and nose by coughing or sneezing into the elbow areas of your shirt or coat, into a tissue or into your sleeve (not your hands). Langley Gauss A MASK when in public  . Avoid shaking hands with others and consider head nods or verbal greetings only. . Avoid touching your eyes, nose, or mouth with unwashed hands.  . Avoid close contact with people who are sick. . Avoid places or events with large numbers of people in one location, like concerts or sporting events. . If you have some symptoms but not all symptoms, continue  to monitor at home and seek medical attention if your symptoms worsen. . If you are having a medical emergency, call 911.   [place] / e-Visit: [email or web address]         [facility]: [facility]: [id number]                   [facility]: [id number]     It is flu season:   >>> Best ways to protect herself from the flu: Receive the yearly flu vaccine, practice good hand hygiene washing with soap and also using hand sanitizer when available, eat a nutritious meals, get adequate rest, hydrate appropriately   Please contact the office if your symptoms worsen or you have concerns that you are not improving.   Thank you for choosing [facility] for your healthcare, and for allowing [place] to partner with you on your healthcare journey. I am thankful to be able to provide care to you today.   Wyn Quaker FNP-C

## 2019-03-01 NOTE — Addendum Note (Signed)
Addended by: Eileen Stanford on: 03/01/2019 02:42 PM   Modules accepted: Orders

## 2019-06-03 ENCOUNTER — Ambulatory Visit: Payer: 59 | Admitting: Internal Medicine

## 2019-11-03 ENCOUNTER — Encounter (INDEPENDENT_AMBULATORY_CARE_PROVIDER_SITE_OTHER): Payer: 59 | Admitting: Ophthalmology

## 2020-01-24 IMAGING — CR RIGHT HAND - COMPLETE 3+ VIEW
3 series · 3 of 3 positions shown · non-contrast
Comparison: None.
COMPARISON: None.

Addendum:
CLINICAL DATA: Status post fall right hand pain

EXAM:
RIGHT HAND - COMPLETE 3+ VIEW

[x hand pa right]
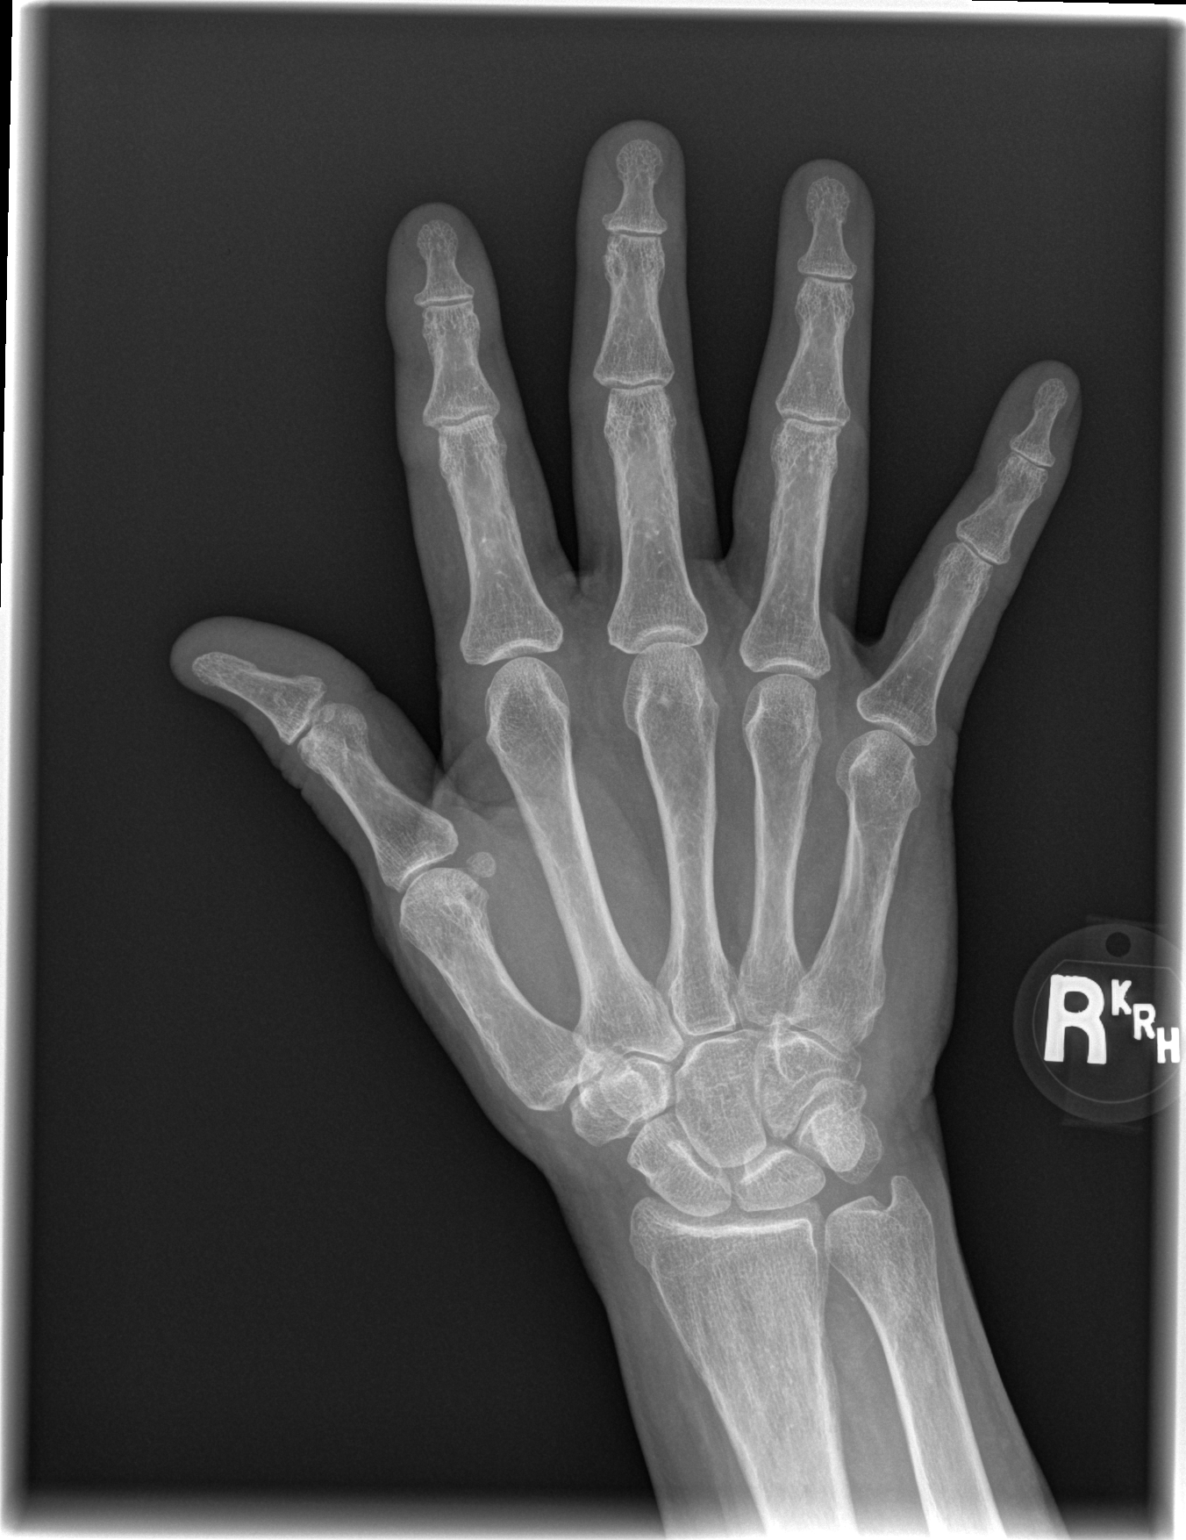

[x hand oblique right]
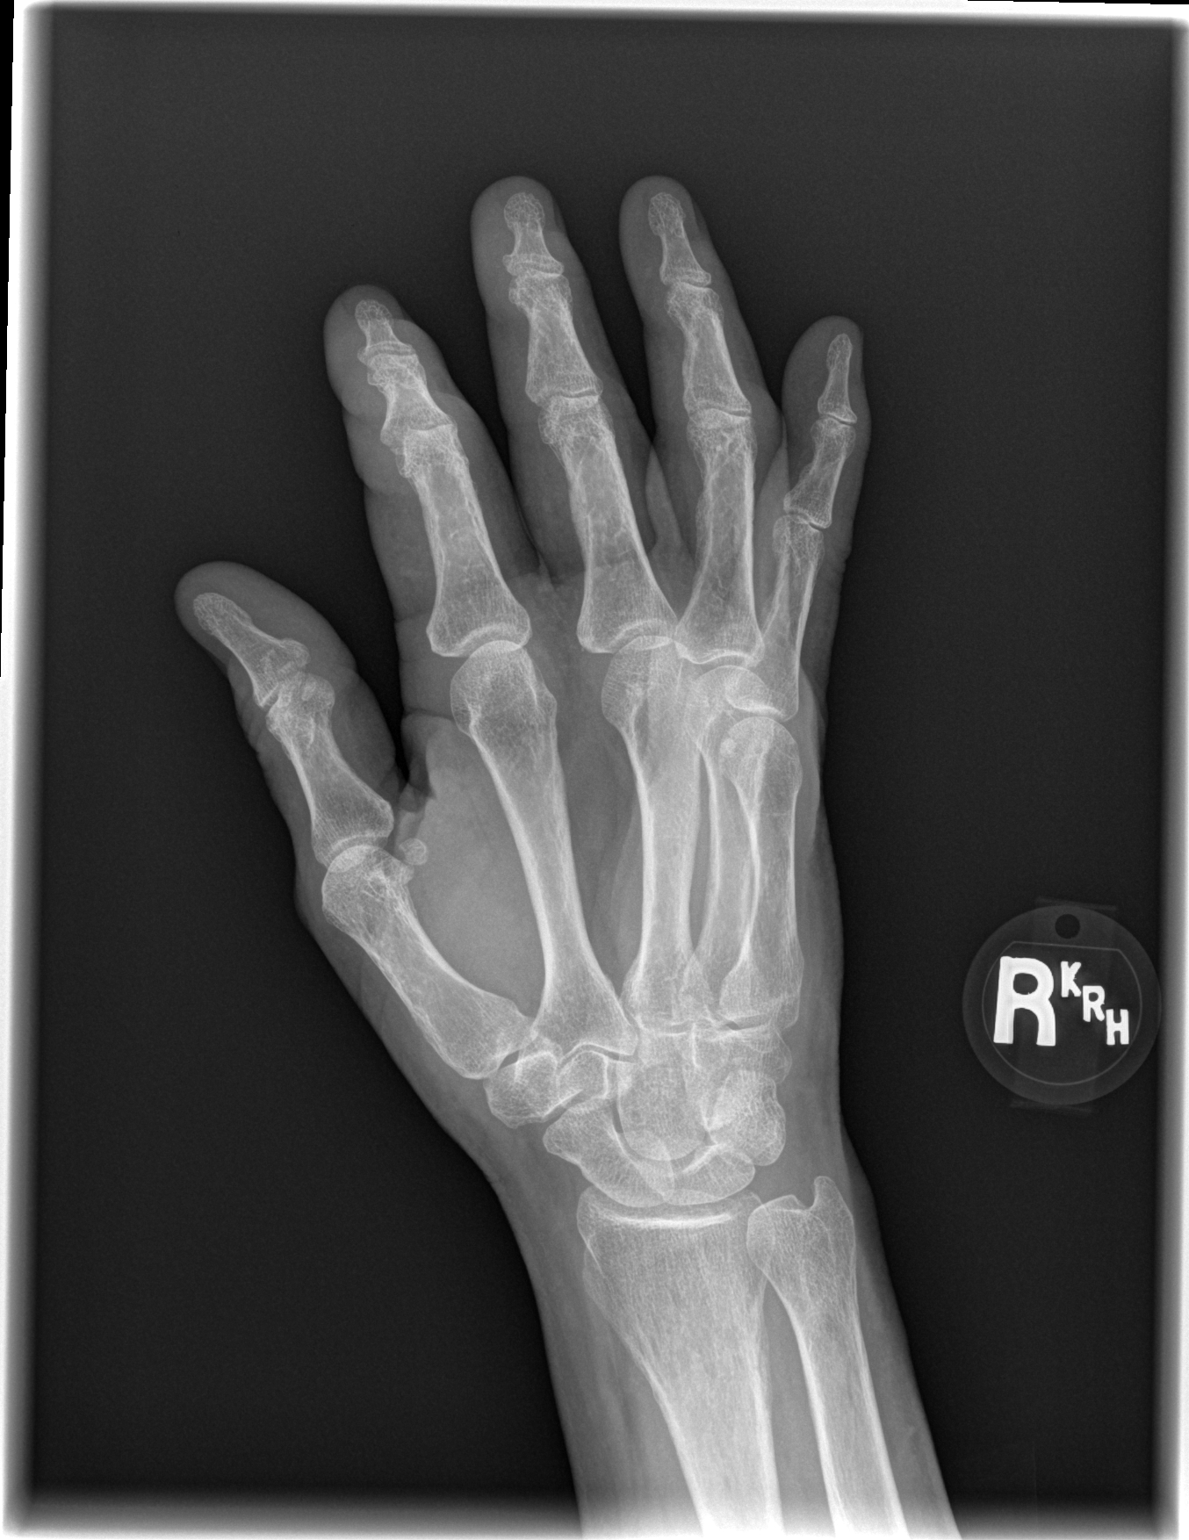

[x hand lat right]
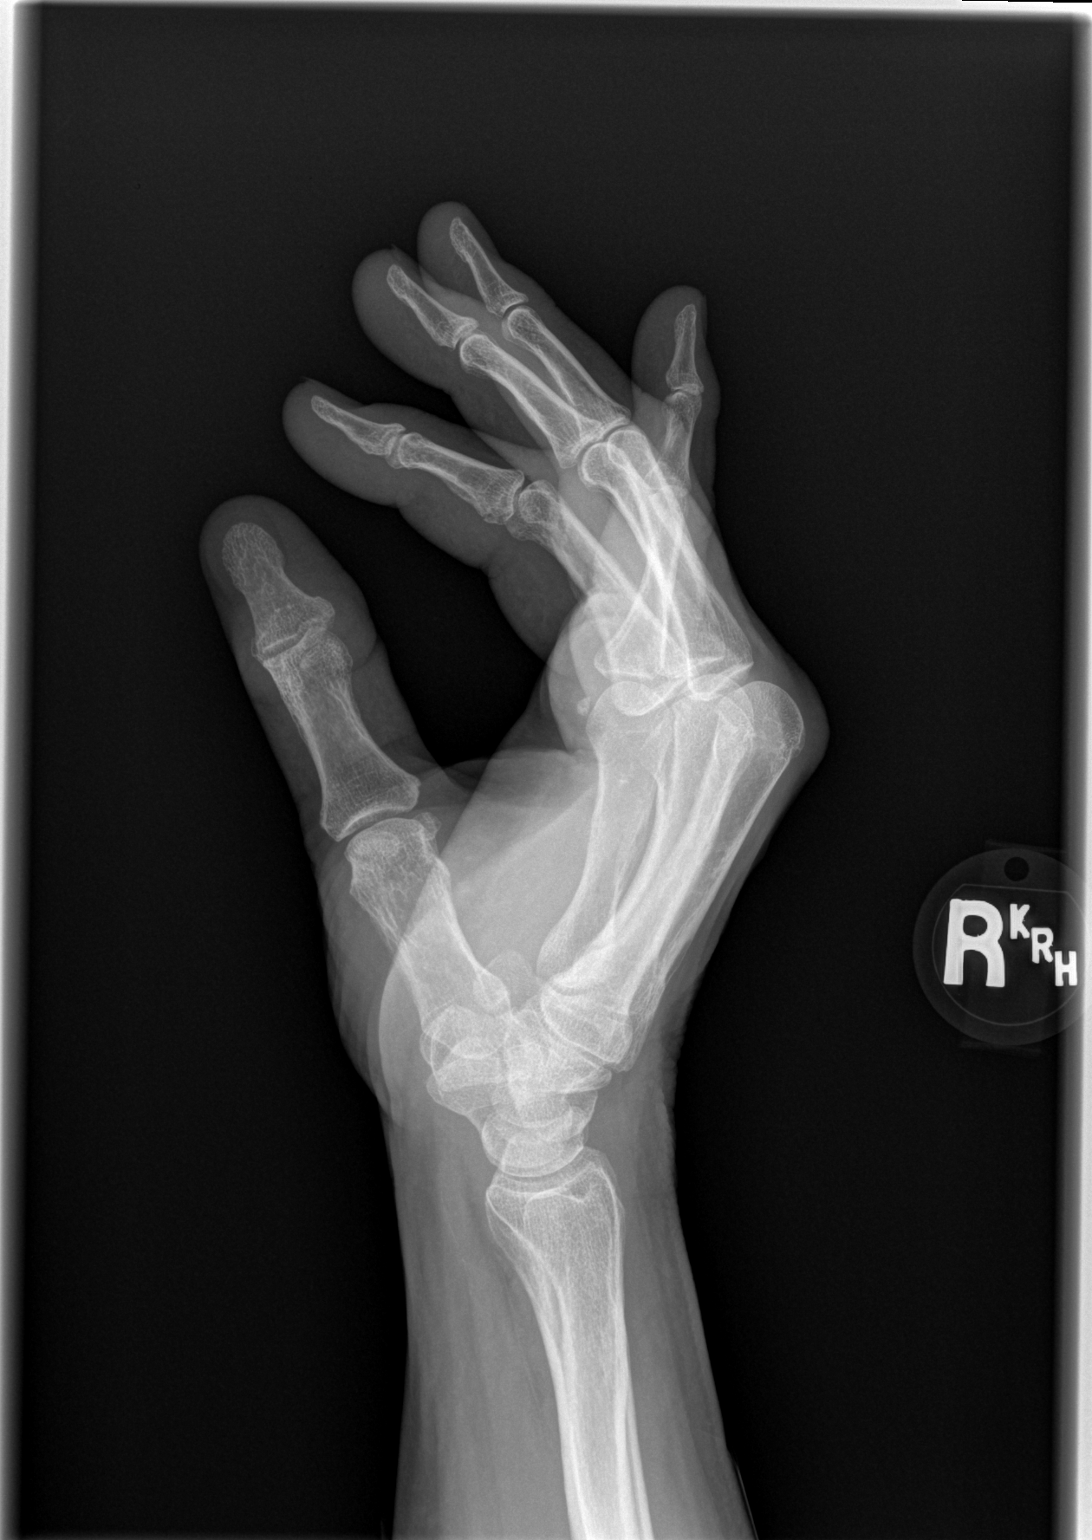

[3 of 3 positions shown; findings below may reference images not displayed]

FINDINGS: Generalized osteopenia. No acute fracture or dislocation. No
aggressive osseous lesion. Mild osteoarthritis of the DIP joints.
Mild osteoarthritis of first CMC joint.
IMPRESSION: No acute osseous injury of the right hand.

ADDENDUM:
Irregularly at the volar base of the second middle phalanx without
displacement likely reflecting an age indeterminate injury.

Injury is felt to reflect a chronic injury given the chronic
deformity of left fifth metacarpal, but acute/subacute injury cannot
be excluded. Correlate with point tenderness.

*** End of Addendum ***
FINDINGS: Generalized osteopenia. No acute fracture or dislocation. No
aggressive osseous lesion. Mild osteoarthritis of the DIP joints.
Mild osteoarthritis of first CMC joint.
IMPRESSION: No acute osseous injury of the right hand.

## 2020-11-23 IMAGING — US US ABDOMEN LIMITED
1 series · 14 of 25 positions shown · non-contrast
Comparison: None.

CLINICAL DATA: Intermittent right upper quadrant and epigastric
pain for 1 year.

EXAM:
ULTRASOUND ABDOMEN LIMITED RIGHT UPPER QUADRANT

[Series 1: us abdomen limited · 0.17mm/px · 14 of 50 slices shown]
[im 1/50]
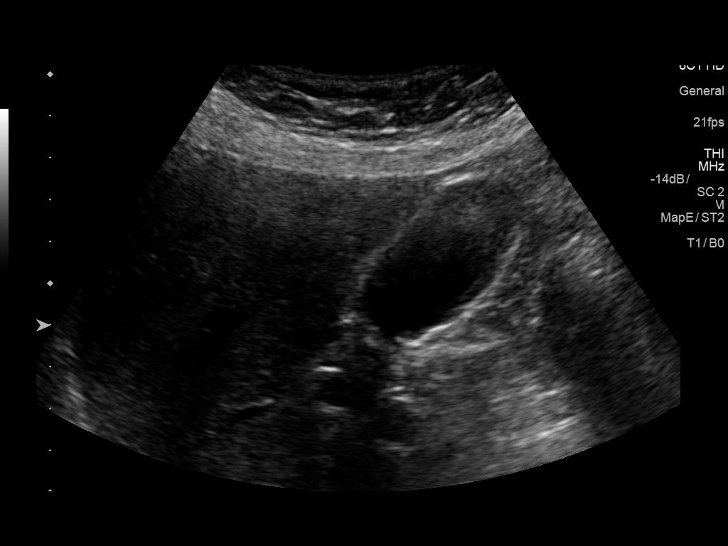
[im 5/50]
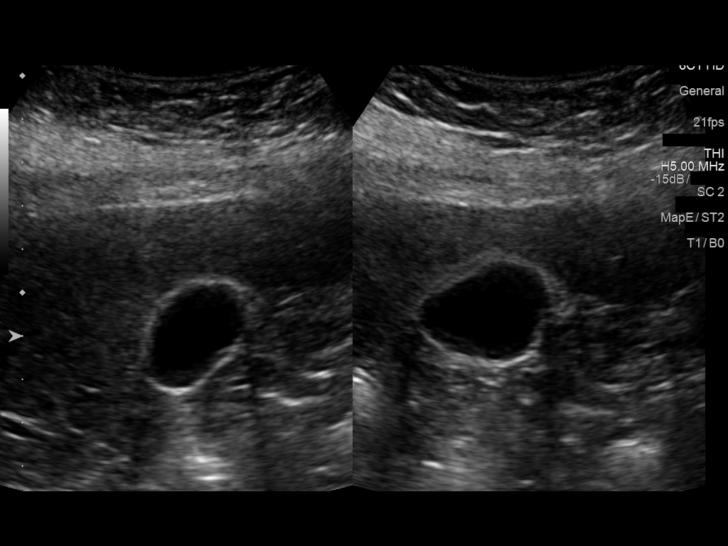
[im 9/50]
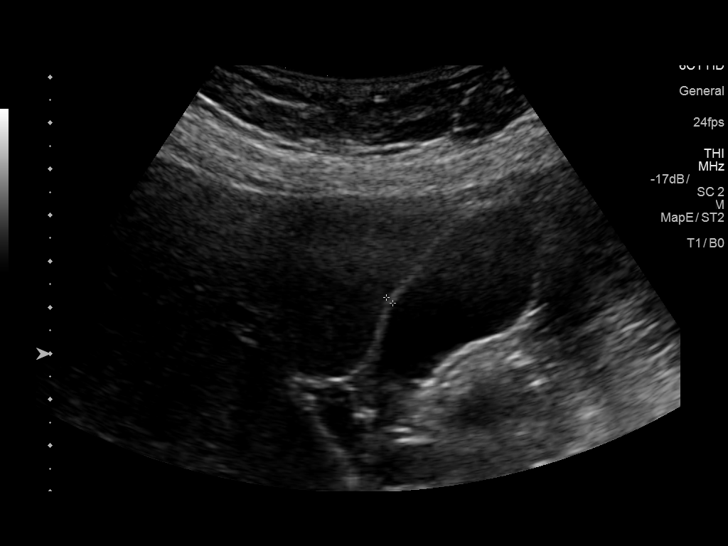
[im 13/50]
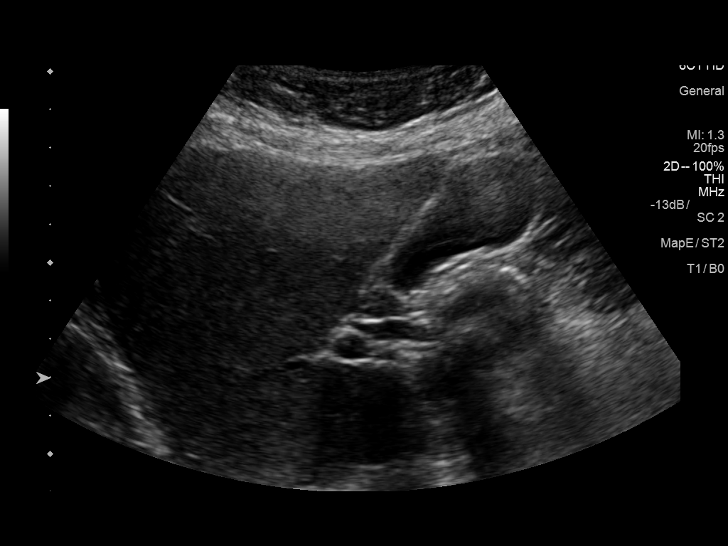
[im 17/50]
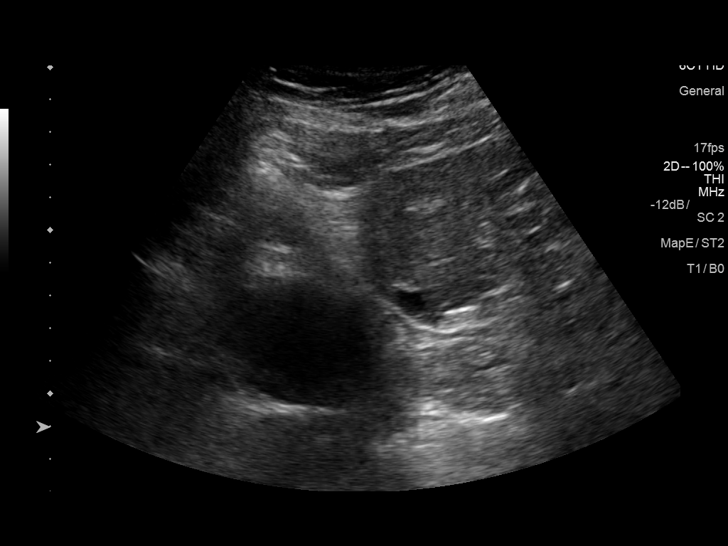
[im 19/50]
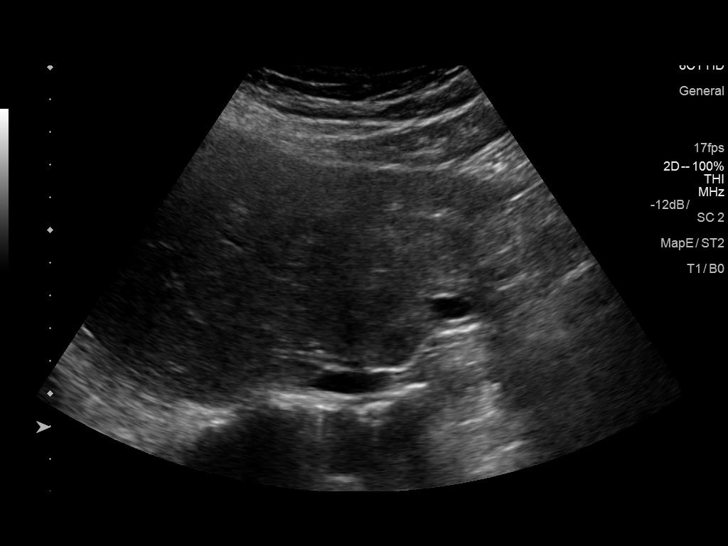
[im 23/50]
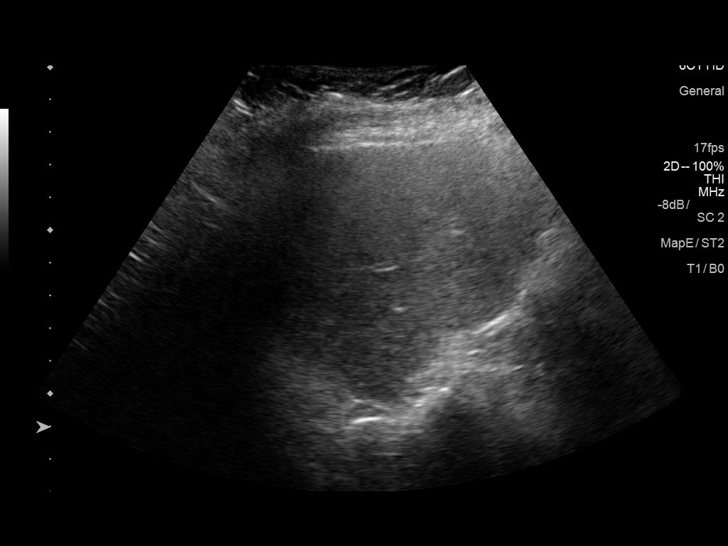
[im 27/50]
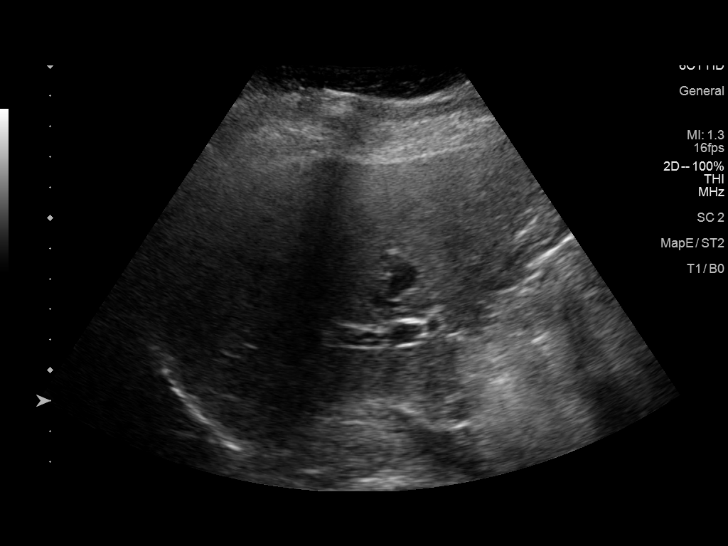
[im 31/50]
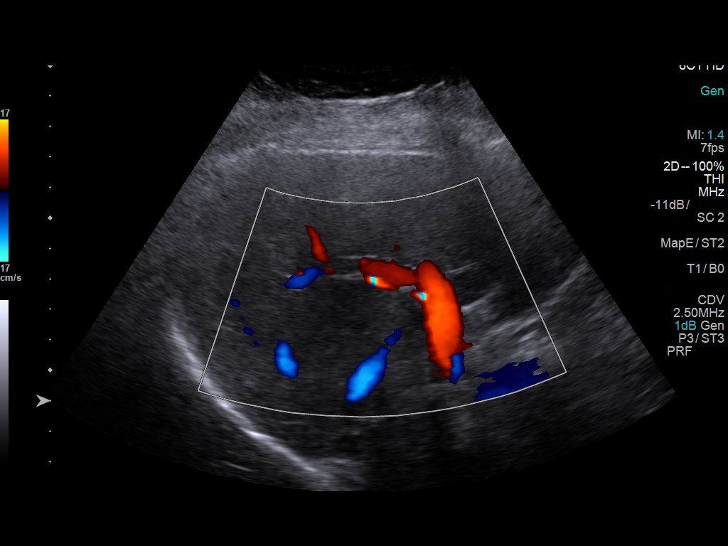
[im 33/50]
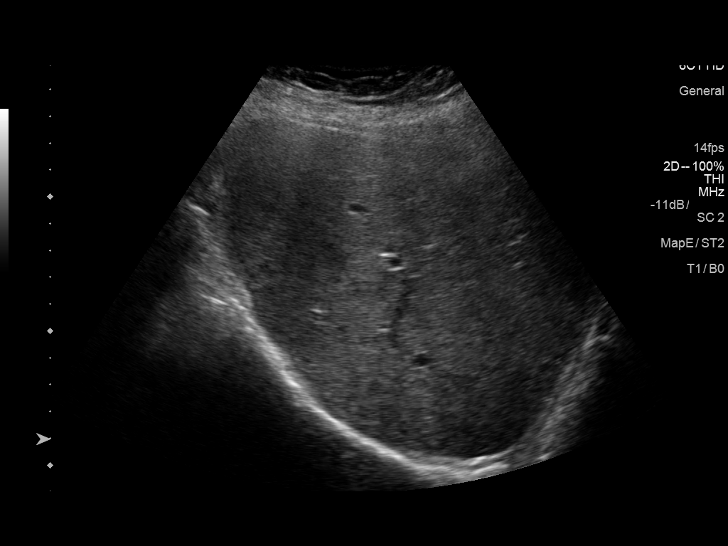
[im 37/50]
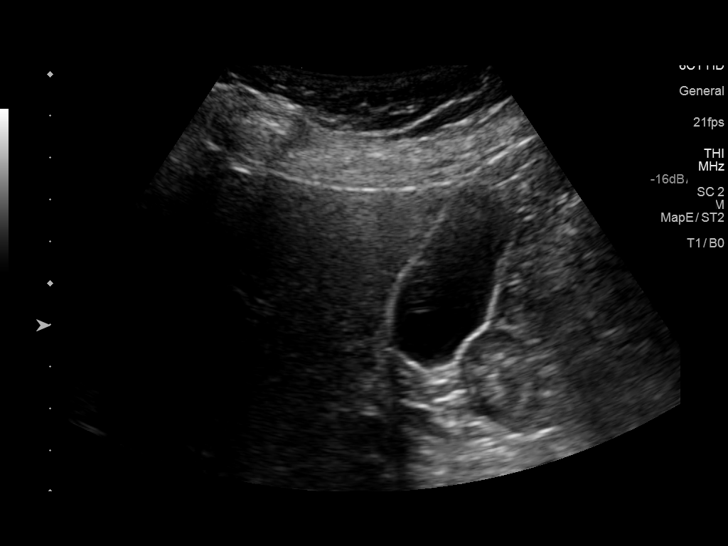
[im 41/50]
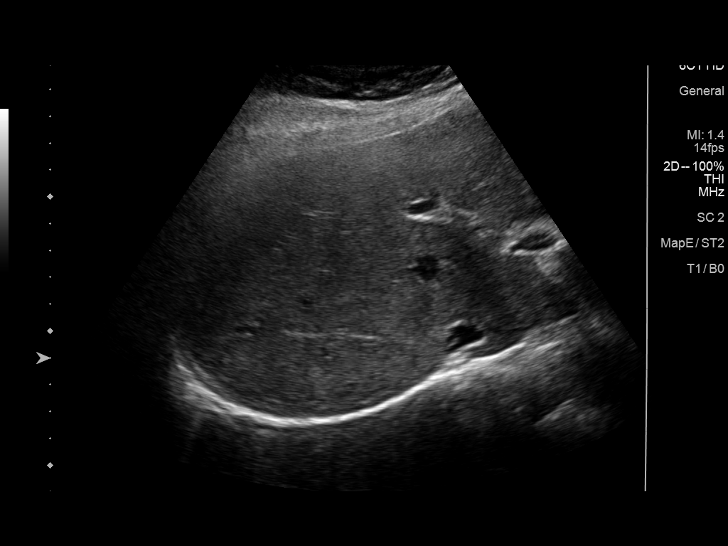
[im 45/50]
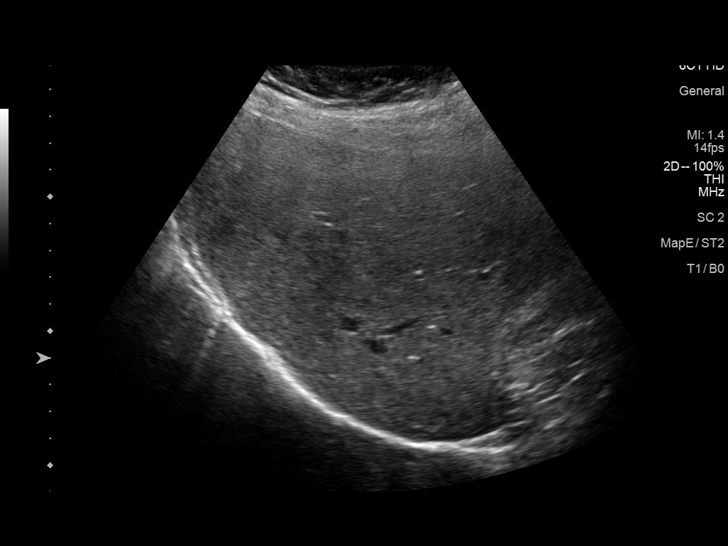
[im 50/50]
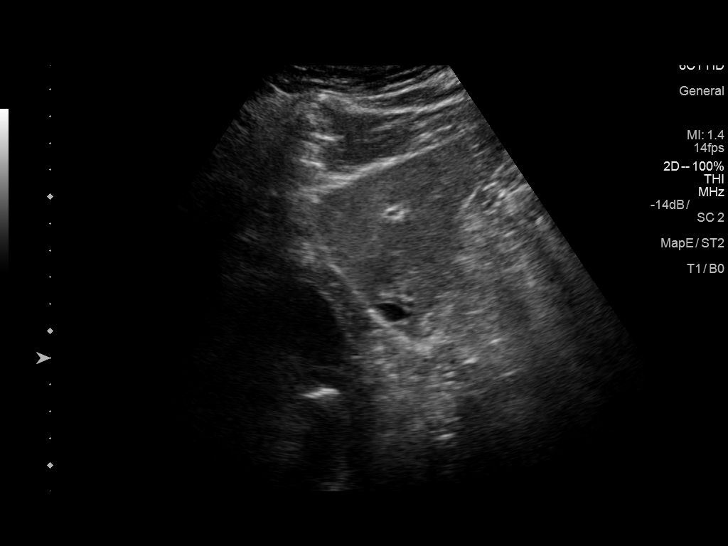

[14 of 25 positions shown; findings below may reference images not displayed]

FINDINGS: Gallbladder:

No gallstones or wall thickening visualized. No sonographic Murphy
sign noted by sonographer.

Common bile duct:

Diameter: 4 mm

Liver:

Normal parenchymal echogenicity. 12 mm inferior left lobe cyst. No
other liver masses or lesions. Portal vein is patent on color
Doppler imaging with normal direction of blood flow towards the
liver.
IMPRESSION: 1. No acute findings.  Normal gallbladder.  No bile duct dilation.
2. Small liver cyst.
